# Patient Record
Sex: Male | Born: 1935 | Race: White | Hispanic: No | Marital: Married | Smoking: Former smoker
Health system: Southern US, Community
[De-identification: ages and names within clinical notes are randomized; demographics above are authoritative.]

## PROBLEM LIST (undated history)

## (undated) DIAGNOSIS — S0990XA Unspecified injury of head, initial encounter: Secondary | ICD-10-CM

## (undated) DIAGNOSIS — C801 Malignant (primary) neoplasm, unspecified: Secondary | ICD-10-CM

## (undated) DIAGNOSIS — R351 Nocturia: Secondary | ICD-10-CM

## (undated) DIAGNOSIS — M1990 Unspecified osteoarthritis, unspecified site: Secondary | ICD-10-CM

## (undated) DIAGNOSIS — K259 Gastric ulcer, unspecified as acute or chronic, without hemorrhage or perforation: Secondary | ICD-10-CM

## (undated) DIAGNOSIS — K219 Gastro-esophageal reflux disease without esophagitis: Secondary | ICD-10-CM

## (undated) DIAGNOSIS — T4145XA Adverse effect of unspecified anesthetic, initial encounter: Secondary | ICD-10-CM

## (undated) DIAGNOSIS — Z8719 Personal history of other diseases of the digestive system: Secondary | ICD-10-CM

## (undated) DIAGNOSIS — Z85828 Personal history of other malignant neoplasm of skin: Secondary | ICD-10-CM

## (undated) HISTORY — PX: JOINT REPLACEMENT: SHX530

## (undated) HISTORY — PX: SHOULDER SURGERY: SHX246

## (undated) HISTORY — PX: WRIST SURGERY: SHX841

---

## 1996-11-07 HISTORY — PX: TOTAL KNEE ARTHROPLASTY: SHX125

## 2003-06-03 ENCOUNTER — Encounter: Admission: RE | Admit: 2003-06-03 | Discharge: 2003-09-01 | Payer: Self-pay | Admitting: Orthopaedic Surgery

## 2005-08-11 ENCOUNTER — Ambulatory Visit: Payer: Self-pay | Admitting: Family Medicine

## 2006-08-29 ENCOUNTER — Ambulatory Visit: Payer: Self-pay | Admitting: Family Medicine

## 2006-09-29 ENCOUNTER — Ambulatory Visit: Payer: Self-pay | Admitting: Family Medicine

## 2012-08-24 ENCOUNTER — Encounter (HOSPITAL_COMMUNITY): Payer: Self-pay | Admitting: *Deleted

## 2012-08-24 ENCOUNTER — Emergency Department (HOSPITAL_COMMUNITY): Payer: Medicare Other

## 2012-08-24 ENCOUNTER — Emergency Department (HOSPITAL_COMMUNITY)
Admission: EM | Admit: 2012-08-24 | Discharge: 2012-08-24 | Disposition: A | Discharge status: Home / Self Care | Payer: Medicare Other | Attending: Emergency Medicine | Admitting: Emergency Medicine

## 2012-08-24 DIAGNOSIS — Z96659 Presence of unspecified artificial knee joint: Secondary | ICD-10-CM | POA: Insufficient documentation

## 2012-08-24 DIAGNOSIS — W208XXA Other cause of strike by thrown, projected or falling object, initial encounter: Secondary | ICD-10-CM | POA: Insufficient documentation

## 2012-08-24 DIAGNOSIS — S2239XA Fracture of one rib, unspecified side, initial encounter for closed fracture: Secondary | ICD-10-CM

## 2012-08-24 HISTORY — DX: Gastric ulcer, unspecified as acute or chronic, without hemorrhage or perforation: K25.9

## 2012-08-24 MED ORDER — HYDROCODONE-ACETAMINOPHEN 5-325 MG PO TABS
1.0000 | ORAL_TABLET | Freq: Four times a day (QID) | ORAL | Status: AC | PRN
Start: 2012-08-24 — End: 2012-09-03

## 2012-08-24 NOTE — ED Notes (Signed)
Pain lt lower ant ribs, 6 days ago cut down a tree and a branch struck him.

## 2012-08-24 NOTE — ED Provider Notes (Signed)
History   This chart was scribed for Bob Jakes, MD by Bob Holland. The patient was seen in room APA11/APA11 and the patient's care was started at 9:32PM    CSN: [id number]  Arrival date & time 08/24/12  8295   First MD Initiated Contact with Patient 08/24/12 2132      Chief Complaint  Patient presents with  . Chest Pain    (Consider location/radiation/quality/duration/timing/severity/associated sxs/prior treatment) Patient is a 78 y.o. male presenting with chest pain. The history is provided by the patient. No language interpreter was used.  Chest Pain The chest pain began yesterday. Chest pain occurs constantly. The chest pain is worsening. The pain is associated with breathing. The severity of the pain is moderate. The pain does not radiate. Chest pain is worsened by deep breathing. Pertinent negatives for primary symptoms include no abdominal pain and no dizziness.  Pertinent negatives for associated symptoms include no near-syncope. He tried nothing for the symptoms.     Bob Holland is a 78 y.o. male who presents to the Emergency Department complaining of sudden, progressively worsening, chest pain located at the left, anterior, lateral rib margin, onset yesterday (08/23/12). The pt reports he was cutting down a tree on Saturday, 08/18/12, when suddenly, a large portion of a tree branch struck him on the left side of his body. Modifying factors include deep breathing which intensifies the chest pain. The pt has a hx of joint replacement, total knee arthroplasty, and allergy to aspirin and cephalexin.   The pt denies abdominal pain, light headedness, back pain, and swelling in the legs.  The pt does not smoke or drink alcohol.   PCP is in [place], [place].    Past Medical History  Diagnosis Date  . Gastric ulcer     Past Surgical History  Procedure Date  . Joint replacement   . Total knee arthroplasty     History reviewed. No pertinent family history.  History    Substance Use Topics  . Smoking status: Never Smoker   . Smokeless tobacco: Not on file  . Alcohol Use: No      Review of Systems  Cardiovascular: Positive for chest pain. Negative for near-syncope.  Gastrointestinal: Negative for abdominal pain.  Neurological: Negative for dizziness.  All other systems reviewed and are negative.    Allergies  Aspirin and Cephalexin  Home Medications   Current Outpatient Rx  Name Route Sig Dispense Refill  . ASPIRIN 325 MG PO TABS Oral Take 325 mg by mouth once.    Marland Kitchen CALCIUM CARBONATE ANTACID 500 MG PO CHEW Oral Chew 1 tablet by mouth daily as needed. For indigestion, acid reflux, heartburn    . HYDROCODONE-ACETAMINOPHEN 5-325 MG PO TABS Oral Take 1-2 tablets by mouth every 6 (six) hours as needed for pain. 20 tablet 0    BP 140/78  Pulse 79  Temp 98.7 F (37.1 C) (Oral)  Resp 18  Ht 6' (1.829 m)  Wt 240 lb (108.863 kg)  BMI 32.55 kg/m2  SpO2 96%  Physical Exam  Nursing note and vitals reviewed. Constitutional: He is oriented to person, place, and time. He appears well-developed and well-nourished.  HENT:  Head: Atraumatic.  Nose: Nose normal.  Eyes: Conjunctivae normal and EOM are normal.  Neck: Normal range of motion.  Cardiovascular: Normal rate, regular rhythm and normal heart sounds.   Pulmonary/Chest: Effort normal and breath sounds normal. He exhibits tenderness.       Left rib margin tenderness. No contusion  or abrasions detected.   Abdominal: Soft. Bowel sounds are normal.  Musculoskeletal: Normal range of motion.       Bruise located on the medial side of left forearm. Abrasions located on the proximal left forearm.   Neurological: He is alert and oriented to person, place, and time.  Skin: Skin is warm and dry.  Psychiatric: He has a normal mood and affect. His behavior is normal.    ED Course  Procedures (including critical care time)  DIAGNOSTIC STUDIES: Oxygen Saturation is 96% on room air, normal by my  interpretation.    COORDINATION OF CARE:    9:45PM- Treatment plan concerning chest x-ray discussed with patient. Pt agrees with treatment.   Labs Reviewed - No data to display Dg Ribs Unilateral W/chest Left  08/24/2012  *RADIOLOGY REPORT*  Clinical Data: Left anterior rib/chest pain  LEFT RIBS AND CHEST - 3+ VIEW  Comparison: None.  Findings: Mild patchy left basilar opacity, likely atelectasis.  No pleural effusion or pneumothorax.  Cardiomediastinal silhouette is within normal limits.  Suspected nondisplaced left anterior 6th rib fracture.  IMPRESSION: Suspected nondisplaced left anterior 6th rib fracture.   Original Report Authenticated By: Charline Bills, M.D.      1. Rib fracture       MDM  Size andx-ray with rib series is consistent with a nondisplaced left anterior sixth rib fracture. This is probably responsible for the pain underlying chest x-ray of the lungs is normal. Will treat with mild pain medicine and reassurance have patient follow primary care Dr.      I personally performed the services described in this documentation, which was scribed in my presence. The recorded information has been reviewed and considered.     Bob Jakes, MD 08/24/12 2255

## 2015-11-08 DIAGNOSIS — T8859XA Other complications of anesthesia, initial encounter: Secondary | ICD-10-CM

## 2015-11-08 HISTORY — DX: Other complications of anesthesia, initial encounter: T88.59XA

## 2015-12-16 ENCOUNTER — Ambulatory Visit: Payer: Self-pay | Admitting: Orthopedic Surgery

## 2016-01-06 ENCOUNTER — Ambulatory Visit: Payer: Self-pay | Admitting: Orthopedic Surgery

## 2016-01-06 NOTE — H&P (Signed)
TOTAL KNEE ADMISSION H&P  Patient is being admitted for right total knee arthroplasty.  Subjective:  Chief Complaint:right knee pain.  HPI: Bob Holland, 82 y.o. male, has a history of pain and functional disability in the right knee due to arthritis and has failed non-surgical conservative treatments for greater than 12 weeks to includeNSAID's and/or analgesics, corticosteriod injections, flexibility and strengthening excercises, use of assistive devices, weight reduction as appropriate and activity modification.  Onset of symptoms was gradual, starting >10 years ago with rapidlly worsening course since that time. The patient noted no past surgery on the right knee(s).  Patient currently rates pain in the right knee(s) at 10 out of 10 with activity. Patient has night pain, worsening of pain with activity and weight bearing, pain that interferes with activities of daily living, pain with passive range of motion and crepitus.  Patient has evidence of subchondral cysts, subchondral sclerosis, periarticular osteophytes, joint subluxation and joint space narrowing by imaging studies. There is no active infection.  There are no active problems to display for this patient.  Past Medical History  Diagnosis Date  . Gastric ulcer     Past Surgical History  Procedure Laterality Date  . Joint replacement    . Total knee arthroplasty       (Not in a hospital admission) Allergies  Allergen Reactions  . Aspirin Other (See Comments)    REACTION: Cannot take due to bleeding ulcers  . Cephalexin Swelling and Rash    Social History  Substance Use Topics  . Smoking status: Never Smoker   . Smokeless tobacco: Not on file  . Alcohol Use: No    No family history on file.   Review of Systems  Constitutional: Negative.   HENT: Negative.   Eyes: Negative.   Respiratory: Negative.   Cardiovascular: Negative.   Gastrointestinal: Negative.   Musculoskeletal: Positive for joint pain.  Skin: Negative.    Neurological: Negative.   Endo/Heme/Allergies: Negative.   Psychiatric/Behavioral: Negative.     Objective:  Physical Exam  Constitutional: He is oriented to person, place, and time. He appears well-developed and well-nourished.  HENT:  Head: Normocephalic and atraumatic.  Eyes: Conjunctivae and EOM are normal. Pupils are equal, round, and reactive to light.  Neck: Normal range of motion. Neck supple.  Cardiovascular: Normal rate, regular rhythm and intact distal pulses.   Respiratory: Effort normal and breath sounds normal. No respiratory distress.  GI: Soft. Bowel sounds are normal. He exhibits no distension.  Genitourinary:  deferred  Musculoskeletal:       Right knee: He exhibits decreased range of motion and LCL laxity. Tenderness found. Medial joint line tenderness noted.  ROM 18-80. 2+ DP  Neurological: He is alert and oriented to person, place, and time. He has normal reflexes.  Skin: Skin is warm and dry.  Psychiatric: He has a normal mood and affect. His behavior is normal. Judgment and thought content normal.    Vital signs in last 24 hours: @VSRANGES @  Labs:   Estimated body mass index is 32.54 kg/(m^2) as calculated from the following:   Height as of 08/24/12: 6' (1.829 m).   Weight as of 08/24/12: 108.863 kg (240 lb).   Imaging Review Plain radiographs demonstrate severe degenerative joint disease of the right knee(s). The overall alignment issignificant varus. The bone quality appears to be fair for age and reported activity level.  Assessment/Plan:  End stage arthritis, right knee   The patient history, physical examination, clinical judgment of the provider  and imaging studies are consistent with end stage degenerative joint disease of the right knee(s) and total knee arthroplasty is deemed medically necessary. The treatment options including medical management, injection therapy arthroscopy and arthroplasty were discussed at length. The risks and  benefits of total knee arthroplasty were presented and reviewed. The risks due to aseptic loosening, infection, stiffness, patella tracking problems, thromboembolic complications and other imponderables were discussed. The patient acknowledged the explanation, agreed to proceed with the plan and consent was signed. Patient is being admitted for inpatient treatment for surgery, pain control, PT, OT, prophylactic antibiotics, VTE prophylaxis, progressive ambulation and ADL's and discharge planning. The patient is planning to be discharged home with home health services

## 2016-01-14 ENCOUNTER — Encounter (HOSPITAL_COMMUNITY)
Admission: RE | Admit: 2016-01-14 | Discharge: 2016-01-14 | Disposition: A | Discharge status: Home / Self Care | Payer: Medicare HMO | Source: Ambulatory Visit | Attending: Orthopedic Surgery | Admitting: Orthopedic Surgery

## 2016-01-14 ENCOUNTER — Encounter (HOSPITAL_COMMUNITY): Payer: Self-pay

## 2016-01-14 DIAGNOSIS — M1711 Unilateral primary osteoarthritis, right knee: Secondary | ICD-10-CM | POA: Diagnosis not present

## 2016-01-14 DIAGNOSIS — Z01812 Encounter for preprocedural laboratory examination: Secondary | ICD-10-CM | POA: Insufficient documentation

## 2016-01-14 DIAGNOSIS — Z0183 Encounter for blood typing: Secondary | ICD-10-CM | POA: Diagnosis not present

## 2016-01-14 HISTORY — DX: Malignant (primary) neoplasm, unspecified: C80.1

## 2016-01-14 HISTORY — DX: Personal history of other malignant neoplasm of skin: Z85.828

## 2016-01-14 HISTORY — DX: Nocturia: R35.1

## 2016-01-14 HISTORY — DX: Unspecified osteoarthritis, unspecified site: M19.90

## 2016-01-14 LAB — PROTIME-INR
INR: 1.21 (ref 0.00–1.49)
Prothrombin Time: 15 seconds (ref 11.6–15.2)

## 2016-01-14 LAB — URINALYSIS, ROUTINE W REFLEX MICROSCOPIC
BILIRUBIN URINE: NEGATIVE
GLUCOSE, UA: NEGATIVE mg/dL
HGB URINE DIPSTICK: NEGATIVE
Ketones, ur: NEGATIVE mg/dL
Leukocytes, UA: NEGATIVE
Nitrite: NEGATIVE
Protein, ur: NEGATIVE mg/dL
SPECIFIC GRAVITY, URINE: 1.027 (ref 1.005–1.030)
pH: 5 (ref 5.0–8.0)

## 2016-01-14 LAB — CBC WITH DIFFERENTIAL/PLATELET
BASOS PCT: 1 %
Basophils Absolute: 0.1 10*3/uL (ref 0.0–0.1)
EOS ABS: 0.2 10*3/uL (ref 0.0–0.7)
EOS PCT: 3 %
HCT: 43 % (ref 39.0–52.0)
Hemoglobin: 14.4 g/dL (ref 13.0–17.0)
Lymphocytes Relative: 36 %
Lymphs Abs: 2.5 10*3/uL (ref 0.7–4.0)
MCH: 30.1 pg (ref 26.0–34.0)
MCHC: 33.5 g/dL (ref 30.0–36.0)
MCV: 90 fL (ref 78.0–100.0)
MONO ABS: 0.9 10*3/uL (ref 0.1–1.0)
MONOS PCT: 13 %
Neutro Abs: 3.3 10*3/uL (ref 1.7–7.7)
Neutrophils Relative %: 47 %
Platelets: 186 10*3/uL (ref 150–400)
RBC: 4.78 MIL/uL (ref 4.22–5.81)
RDW: 14 % (ref 11.5–15.5)
WBC: 6.9 10*3/uL (ref 4.0–10.5)

## 2016-01-14 LAB — COMPREHENSIVE METABOLIC PANEL
ALBUMIN: 3.7 g/dL (ref 3.5–5.0)
ALT: 17 U/L (ref 17–63)
ANION GAP: 7 (ref 5–15)
AST: 23 U/L (ref 15–41)
Alkaline Phosphatase: 68 U/L (ref 38–126)
BUN: 17 mg/dL (ref 6–20)
CO2: 25 mmol/L (ref 22–32)
Calcium: 8.7 mg/dL — ABNORMAL LOW (ref 8.9–10.3)
Chloride: 107 mmol/L (ref 101–111)
Creatinine, Ser: 0.92 mg/dL (ref 0.61–1.24)
GFR calc non Af Amer: >60 mL/min (ref >60)
GLUCOSE: 95 mg/dL (ref 65–99)
POTASSIUM: 4.5 mmol/L (ref 3.5–5.1)
SODIUM: 139 mmol/L (ref 135–145)
TOTAL PROTEIN: 7.5 g/dL (ref 6.5–8.1)
Total Bilirubin: 0.6 mg/dL (ref 0.3–1.2)

## 2016-01-14 LAB — ABO/RH: ABO/RH(D): O NEG

## 2016-01-14 LAB — APTT: aPTT: 35 seconds (ref 24–37)

## 2016-01-14 LAB — SURGICAL PCR SCREEN
MRSA, PCR: NEGATIVE
STAPHYLOCOCCUS AUREUS: NEGATIVE

## 2016-01-14 NOTE — Patient Instructions (Addendum)
YOUR PROCEDURE IS SCHEDULED ON : 01/21/16  REPORT TO [facility] MAIN ENTRANCE FOLLOW SIGNS TO EAST ELEVATOR - GO TO 3rd FLOOR CHECK IN AT 3 EAST NURSES STATION (SHORT STAY) AT:  1:45 PM  CALL THIS NUMBER IF YOU HAVE PROBLEMS THE MORNING OF SURGERY [phone]  REMEMBER:ONLY 1 PER PERSON MAY GO TO SHORT STAY WITH YOU TO GET READY THE MORNING OF YOUR SURGERY  DO NOT EAT FOOD  AFTER MIDNIGHT  MAY HAVE CLEAR LIQUIDS UNTIL 9:45 AM  TAKE THESE MEDICINES THE MORNING OF SURGERY: NONE  CLEAR LIQUID DIET  Foods Allowed                                                                     Foods Excluded  Coffee and tea, regular and decaf                             liquids that you cannot  Plain Jell-O in any flavor                                             see through such as: Fruit ices (not with fruit pulp)                                     milk, soups, orange juice  Iced Popsicles                                                  Creamers                                                                                                     Carbonated beverages, regular and diet                  ALL SOLID FOOD                 Cranberry, grape and apple juices Sports drinks like Gatorade Lightly seasoned clear broth or consume(fat free) Sugar, honey syrup  _____________________________________________________________________   YOU MAY NOT HAVE ANY METAL ON YOUR BODY INCLUDING HAIR PINS AND PIERCING'S. DO NOT WEAR JEWELRY, MAKEUP, LOTIONS, POWDERS OR PERFUMES. DO NOT WEAR NAIL POLISH. DO NOT SHAVE 48 HRS PRIOR TO SURGERY. MEN MAY SHAVE FACE AND NECK.  DO NOT [place]. [facility] IS NOT RESPONSIBLE FOR VALUABLES.  CONTACTS, DENTURES OR PARTIALS MAY NOT BE WORN TO SURGERY. LEAVE  SUITCASE IN CAR. CAN BE BROUGHT TO ROOM AFTER SURGERY.  PATIENTS DISCHARGED THE DAY OF SURGERY WILL NOT BE ALLOWED TO DRIVE HOME.  PLEASE READ OVER THE FOLLOWING  INSTRUCTION SHEETS _________________________________________________________________________________                                          Bob Holland - PREPARING FOR SURGERY  Before surgery, you can play an important role.  Because skin is not sterile, your skin needs to be as free of germs as possible.  You can reduce the number of germs on your skin by washing with CHG (chlorahexidine gluconate) soap before surgery.  CHG is an antiseptic cleaner which kills germs and bonds with the skin to continue killing germs even after washing. Please DO NOT use if you have an allergy to CHG or antibacterial soaps.  If your skin becomes reddened/irritated stop using the CHG and inform your nurse when you arrive at Short Stay. Do not shave (including legs and underarms) for at least 48 hours prior to the first CHG shower.  You may shave your face. Please follow these instructions carefully:   1.  Shower with CHG Soap the night before surgery and the  morning of Surgery.   2.  If you choose to wash your hair, wash your hair first as usual with your  normal  Shampoo.   3.  After you shampoo, rinse your hair and body thoroughly to remove the  shampoo.                                         4.  Use CHG as you would any other liquid soap.  You can apply chg directly  to the skin and wash . Gently wash with scrungie or clean wascloth    5.  Apply the CHG Soap to your body ONLY FROM THE NECK DOWN.   Do not use on open                           Wound or open sores. Avoid contact with eyes, ears mouth and genitals (private parts).                        Genitals (private parts) with your normal soap.              6.  Wash thoroughly, paying special attention to the area where your surgery  will be performed.   7.  Thoroughly rinse your body with warm water from the neck down.   8.  DO NOT shower/wash with your normal soap after using and rinsing off  the CHG Soap .                9.  Pat yourself dry  with a clean towel.             10.  Wear clean night clothes to bed after shower             11.  Place clean sheets on your bed the night of your first shower and do not  sleep with pets.  Day of Surgery : Do not apply any lotions/deodorants the morning of surgery.  Please wear clean clothes  to the hospital/surgery center.  FAILURE TO FOLLOW THESE INSTRUCTIONS MAY RESULT IN THE CANCELLATION OF YOUR SURGERY    PATIENT SIGNATURE_________________________________  ______________________________________________________________________     Bob Holland  An incentive spirometer is a tool that can help keep your lungs clear and active. This tool measures how well you are filling your lungs with each breath. Taking long deep breaths may help reverse or decrease the chance of developing breathing (pulmonary) problems (especially infection) following:  A long period of time when you are unable to move or be active. BEFORE THE PROCEDURE   If the spirometer includes an indicator to show your best effort, your nurse or respiratory therapist will set it to a desired goal.  If possible, sit up straight or lean slightly forward. Try not to slouch.  Hold the incentive spirometer in an upright position. INSTRUCTIONS FOR USE   Sit on the edge of your bed if possible, or sit up as far as you can in bed or on a chair.  Hold the incentive spirometer in an upright position.  Breathe out normally.  Place the mouthpiece in your mouth and seal your lips tightly around it.  Breathe in slowly and as deeply as possible, raising the piston or the ball toward the top of the column.  Hold your breath for 3-5 seconds or for as long as possible. Allow the piston or ball to fall to the bottom of the column.  Remove the mouthpiece from your mouth and breathe out normally.  Rest for a few seconds and repeat Steps 1 through 7 at least 10 times every 1-2 hours when you are awake. Take your time and  take a few normal breaths between deep breaths.  The spirometer may include an indicator to show your best effort. Use the indicator as a goal to work toward during each repetition.  After each set of 10 deep breaths, practice coughing to be sure your lungs are clear. If you have an incision (the cut made at the time of surgery), support your incision when coughing by placing a pillow or rolled up towels firmly against it. Once you are able to get out of bed, walk around indoors and cough well. You may stop using the incentive spirometer when instructed by your caregiver.  RISKS AND COMPLICATIONS  Take your time so you do not get dizzy or light-headed.  If you are in pain, you may need to take or ask for pain medication before doing incentive spirometry. It is harder to take a deep breath if you are having pain. AFTER USE  Rest and breathe slowly and easily.  It can be helpful to keep track of a log of your progress. Your caregiver can provide you with a simple table to help with this. If you are using the spirometer at home, follow these instructions: [place] IF:   You are having difficultly using the spirometer.  You have trouble using the spirometer as often as instructed.  Your pain medication is not giving enough relief while using the spirometer.  You develop fever of 100.5 F (38.1 C) or higher. SEEK IMMEDIATE MEDICAL CARE IF:   You cough up bloody sputum that had not been present before.  You develop fever of 102 F (38.9 C) or greater.  You develop worsening pain at or near the incision site. MAKE SURE YOU:   Understand these instructions.  Will watch your condition.  Will get help right away if you are not doing well  or get worse. Document Released: 03/06/2007 Document Revised: 01/16/2012 Document Reviewed: 05/07/2007 [employer] Patient Information 2014 [employer], [place].   ________________________________________________________________________  WHAT  IS A BLOOD TRANSFUSION? Blood Transfusion Information  A transfusion is the replacement of blood or some of its parts. Blood is made up of multiple cells which provide different functions.  Red blood cells carry oxygen and are used for blood loss replacement.  White blood cells fight against infection.  Platelets control bleeding.  Plasma helps clot blood.  Other blood products are available for specialized needs, such as hemophilia or other clotting disorders. BEFORE THE TRANSFUSION  Who gives blood for transfusions?   Healthy volunteers who are fully evaluated to make sure their blood is safe. This is blood bank blood. Transfusion therapy is the safest it has ever been in the practice of medicine. Before blood is taken from a donor, a complete history is taken to make sure that person has no history of diseases nor engages in risky social behavior (examples are intravenous drug use or sexual activity with multiple partners). The donor's travel history is screened to minimize risk of transmitting infections, such as malaria. The donated blood is tested for signs of infectious diseases, such as HIV and hepatitis. The blood is then tested to be sure it is compatible with you in order to minimize the chance of a transfusion reaction. If you or a relative donates blood, this is often done in anticipation of surgery and is not appropriate for emergency situations. It takes many days to process the donated blood. RISKS AND COMPLICATIONS Although transfusion therapy is very safe and saves many lives, the main dangers of transfusion include:   Getting an infectious disease.  Developing a transfusion reaction. This is an allergic reaction to something in the blood you were given. Every precaution is taken to prevent this. The decision to have a blood transfusion has been considered carefully by your caregiver before blood is given. Blood is not given unless the benefits outweigh the risks. AFTER THE  TRANSFUSION  Right after receiving a blood transfusion, you will usually feel much better and more energetic. This is especially true if your red blood cells have gotten low (anemic). The transfusion raises the level of the red blood cells which carry oxygen, and this usually causes an energy increase.  The nurse administering the transfusion will monitor you carefully for complications. HOME CARE INSTRUCTIONS  No special instructions are needed after a transfusion. You may find your energy is better. Speak with your caregiver about any limitations on activity for underlying diseases you may have. SEEK MEDICAL CARE IF:   Your condition is not improving after your transfusion.  You develop redness or irritation at the intravenous (IV) site. SEEK IMMEDIATE MEDICAL CARE IF:  Any of the following symptoms occur over the next 12 hours:  Shaking chills.  You have a temperature by mouth above 102 F (38.9 C), not controlled by medicine.  Chest, back, or muscle pain.  People around you feel you are not acting correctly or are confused.  Shortness of breath or difficulty breathing.  Dizziness and fainting.  You get a rash or develop hives.  You have a decrease in urine output.  Your urine turns a dark color or changes to pink, red, or brown. Any of the following symptoms occur over the next 10 days:  You have a temperature by mouth above 102 F (38.9 C), not controlled by medicine.  Shortness of breath.  Weakness after normal activity.  The white part of the eye turns yellow (jaundice).  You have a decrease in the amount of urine or are urinating less often.  Your urine turns a dark color or changes to pink, red, or brown. Document Released: 10/21/2000 Document Revised: 01/16/2012 Document Reviewed: 06/09/2008 [facility] Patient Information 2014 [place], [place].  _______________________________________________________________________

## 2016-01-20 NOTE — Progress Notes (Signed)
PT informed of surgery time change - instructed to arrive at 10:45 am - clear liquids until 6:45 am

## 2016-01-21 ENCOUNTER — Encounter (HOSPITAL_COMMUNITY): Payer: Self-pay | Admitting: *Deleted

## 2016-01-21 ENCOUNTER — Inpatient Hospital Stay (HOSPITAL_COMMUNITY): Payer: Medicare HMO

## 2016-01-21 ENCOUNTER — Encounter (HOSPITAL_COMMUNITY)
Admission: RE | Disposition: A | Discharge status: Home Health | Payer: Self-pay | Source: Ambulatory Visit | Attending: Orthopedic Surgery

## 2016-01-21 ENCOUNTER — Inpatient Hospital Stay (HOSPITAL_COMMUNITY): Payer: Medicare HMO | Admitting: Anesthesiology

## 2016-01-21 ENCOUNTER — Inpatient Hospital Stay: Admit: 2016-01-21 | Payer: Self-pay | Admitting: Orthopedic Surgery

## 2016-01-21 ENCOUNTER — Inpatient Hospital Stay (HOSPITAL_COMMUNITY)
Admission: RE | Admit: 2016-01-21 | Discharge: 2016-01-22 | DRG: 470 | Disposition: A | Discharge status: Home Health | Payer: Medicare HMO | Source: Ambulatory Visit | Attending: Orthopedic Surgery | Admitting: Orthopedic Surgery

## 2016-01-21 DIAGNOSIS — M24562 Contracture, left knee: Secondary | ICD-10-CM | POA: Diagnosis present

## 2016-01-21 DIAGNOSIS — Z888 Allergy status to other drugs, medicaments and biological substances status: Secondary | ICD-10-CM

## 2016-01-21 DIAGNOSIS — Z886 Allergy status to analgesic agent status: Secondary | ICD-10-CM

## 2016-01-21 DIAGNOSIS — Z09 Encounter for follow-up examination after completed treatment for conditions other than malignant neoplasm: Secondary | ICD-10-CM

## 2016-01-21 DIAGNOSIS — M24561 Contracture, right knee: Secondary | ICD-10-CM | POA: Diagnosis present

## 2016-01-21 DIAGNOSIS — Z85828 Personal history of other malignant neoplasm of skin: Secondary | ICD-10-CM | POA: Diagnosis not present

## 2016-01-21 DIAGNOSIS — M1711 Unilateral primary osteoarthritis, right knee: Secondary | ICD-10-CM | POA: Diagnosis present

## 2016-01-21 HISTORY — PX: KNEE ARTHROPLASTY: SHX992

## 2016-01-21 LAB — TYPE AND SCREEN
ABO/RH(D): O NEG
ANTIBODY SCREEN: NEGATIVE

## 2016-01-21 SURGERY — ARTHROPLASTY, KNEE, TOTAL, USING IMAGELESS COMPUTER-ASSISTED NAVIGATION
Anesthesia: General | Site: Knee | Laterality: Right

## 2016-01-21 MED ORDER — DOCUSATE SODIUM 100 MG PO CAPS
100.0000 mg | ORAL_CAPSULE | Freq: Two times a day (BID) | ORAL | Status: DC
Start: 2016-01-21 — End: 2016-01-22
  Administered 2016-01-21 – 2016-01-22 (×2): 100 mg via ORAL

## 2016-01-21 MED ORDER — ROCURONIUM BROMIDE 100 MG/10ML IV SOLN
INTRAVENOUS | Status: AC
  Filled 2016-01-21: qty 1

## 2016-01-21 MED ORDER — SODIUM CHLORIDE 0.9 % IV SOLN: INTRAVENOUS | Status: DC

## 2016-01-21 MED ORDER — CLINDAMYCIN PHOSPHATE 600 MG/50ML IV SOLN
600.0000 mg | Freq: Four times a day (QID) | INTRAVENOUS | Status: AC
  Administered 2016-01-21: 600 mg via INTRAVENOUS
  Filled 2016-01-21 (×2): qty 50

## 2016-01-21 MED ORDER — ONDANSETRON HCL 4 MG/2ML IJ SOLN
4.0000 mg | Freq: Four times a day (QID) | INTRAMUSCULAR | Status: DC | PRN
  Administered 2016-01-21: 4 mg via INTRAVENOUS
  Filled 2016-01-21: qty 2

## 2016-01-21 MED ORDER — TRANEXAMIC ACID 1000 MG/10ML IV SOLN
1000.0000 mg | Freq: Once | INTRAVENOUS | Status: AC
  Administered 2016-01-21: 1000 mg via INTRAVENOUS
  Filled 2016-01-21: qty 10

## 2016-01-21 MED ORDER — HYDROMORPHONE HCL 2 MG/ML IJ SOLN
INTRAMUSCULAR | Status: AC
  Filled 2016-01-21: qty 1

## 2016-01-21 MED ORDER — ACETAMINOPHEN 650 MG RE SUPP: 650.0000 mg | Freq: Four times a day (QID) | RECTAL | Status: DC | PRN

## 2016-01-21 MED ORDER — APIXABAN 2.5 MG PO TABS
2.5000 mg | ORAL_TABLET | Freq: Two times a day (BID) | ORAL | Status: DC
  Administered 2016-01-22: 2.5 mg via ORAL
  Filled 2016-01-21 (×3): qty 1

## 2016-01-21 MED ORDER — HYDROGEN PEROXIDE 3 % EX SOLN
CUTANEOUS | Status: DC | PRN
  Administered 2016-01-21: 1

## 2016-01-21 MED ORDER — PROMETHAZINE HCL 25 MG/ML IJ SOLN: 6.2500 mg | INTRAMUSCULAR | Status: DC | PRN

## 2016-01-21 MED ORDER — ISOPROPYL ALCOHOL (RUBBING) 70 % SOLN
Status: DC | PRN
  Administered 2016-01-21: 60 mL via TOPICAL

## 2016-01-21 MED ORDER — ALUM & MAG HYDROXIDE-SIMETH 200-200-20 MG/5ML PO SUSP: 30.0000 mL | ORAL | Status: DC | PRN

## 2016-01-21 MED ORDER — POVIDONE-IODINE 10 % EX SOLN
CUTANEOUS | Status: DC | PRN
  Administered 2016-01-21: 1 via TOPICAL

## 2016-01-21 MED ORDER — HYDROGEN PEROXIDE 3 % EX SOLN
CUTANEOUS | Status: AC
  Filled 2016-01-21: qty 473

## 2016-01-21 MED ORDER — SODIUM CHLORIDE 0.9 % IR SOLN
Status: DC | PRN
  Administered 2016-01-21: 1000 mL

## 2016-01-21 MED ORDER — BUPIVACAINE HCL (PF) 0.5 % IJ SOLN
INTRAMUSCULAR | Status: DC | PRN
  Administered 2016-01-21: 20 mL via PERINEURAL

## 2016-01-21 MED ORDER — KETOROLAC TROMETHAMINE 30 MG/ML IJ SOLN
INTRAMUSCULAR | Status: DC | PRN
  Administered 2016-01-21: 30 mg

## 2016-01-21 MED ORDER — SODIUM CHLORIDE 0.9 % IR SOLN
Status: DC | PRN
  Administered 2016-01-21: 2000 mL

## 2016-01-21 MED ORDER — LIDOCAINE HCL (CARDIAC) 20 MG/ML IV SOLN
INTRAVENOUS | Status: AC
  Filled 2016-01-21: qty 5

## 2016-01-21 MED ORDER — LIDOCAINE-EPINEPHRINE 2 %-1:100000 IJ SOLN
INTRAMUSCULAR | Status: DC | PRN
  Administered 2016-01-21: 10 mL via PERINEURAL

## 2016-01-21 MED ORDER — MIDAZOLAM HCL 5 MG/5ML IJ SOLN
INTRAMUSCULAR | Status: DC | PRN
  Administered 2016-01-21 (×2): 1 mg via INTRAVENOUS

## 2016-01-21 MED ORDER — MIDAZOLAM HCL 2 MG/2ML IJ SOLN
INTRAMUSCULAR | Status: AC
  Filled 2016-01-21: qty 2

## 2016-01-21 MED ORDER — STERILE WATER FOR IRRIGATION IR SOLN
Status: DC | PRN
  Administered 2016-01-21: 2000 mL

## 2016-01-21 MED ORDER — PROPOFOL 10 MG/ML IV BOLUS
INTRAVENOUS | Status: DC | PRN
Start: 2016-01-21 — End: 2016-01-21
  Administered 2016-01-21: 150 mg via INTRAVENOUS

## 2016-01-21 MED ORDER — DEXAMETHASONE SODIUM PHOSPHATE 10 MG/ML IJ SOLN
INTRAMUSCULAR | Status: DC | PRN
  Administered 2016-01-21: 10 mg via INTRAVENOUS

## 2016-01-21 MED ORDER — HYDROMORPHONE HCL 1 MG/ML IJ SOLN: 0.5000 mg | INTRAMUSCULAR | Status: DC | PRN

## 2016-01-21 MED ORDER — BUPIVACAINE-EPINEPHRINE (PF) 0.25% -1:200000 IJ SOLN
INTRAMUSCULAR | Status: AC
  Filled 2016-01-21: qty 30

## 2016-01-21 MED ORDER — CHLORHEXIDINE GLUCONATE 4 % EX LIQD: 60.0000 mL | Freq: Once | CUTANEOUS | Status: DC

## 2016-01-21 MED ORDER — ONDANSETRON HCL 4 MG PO TABS: 4.0000 mg | ORAL_TABLET | Freq: Four times a day (QID) | ORAL | Status: DC | PRN

## 2016-01-21 MED ORDER — FENTANYL CITRATE (PF) 250 MCG/5ML IJ SOLN
INTRAMUSCULAR | Status: AC
  Filled 2016-01-21: qty 5

## 2016-01-21 MED ORDER — ROCURONIUM BROMIDE 100 MG/10ML IV SOLN
INTRAVENOUS | Status: DC | PRN
  Administered 2016-01-21: 30 mg via INTRAVENOUS
  Administered 2016-01-21 (×3): 10 mg via INTRAVENOUS

## 2016-01-21 MED ORDER — KETOROLAC TROMETHAMINE 30 MG/ML IJ SOLN
INTRAMUSCULAR | Status: AC
  Filled 2016-01-21: qty 1

## 2016-01-21 MED ORDER — SODIUM CHLORIDE 0.9 % IJ SOLN
INTRAMUSCULAR | Status: DC | PRN
  Administered 2016-01-21: 30 mL

## 2016-01-21 MED ORDER — PROPOFOL 10 MG/ML IV BOLUS
INTRAVENOUS | Status: AC
  Filled 2016-01-21: qty 20

## 2016-01-21 MED ORDER — SODIUM CHLORIDE 0.9 % IV SOLN
INTRAVENOUS | Status: DC
Start: 2016-01-21 — End: 2016-01-22
  Administered 2016-01-21: 20:00:00 via INTRAVENOUS

## 2016-01-21 MED ORDER — DEXAMETHASONE SODIUM PHOSPHATE 10 MG/ML IJ SOLN
INTRAMUSCULAR | Status: AC
  Filled 2016-01-21: qty 1

## 2016-01-21 MED ORDER — CLINDAMYCIN PHOSPHATE 900 MG/50ML IV SOLN
900.0000 mg | INTRAVENOUS | Status: AC
  Administered 2016-01-21: 900 mg via INTRAVENOUS

## 2016-01-21 MED ORDER — ACETAMINOPHEN 10 MG/ML IV SOLN
1000.0000 mg | INTRAVENOUS | Status: AC
  Administered 2016-01-21: 1000 mg via INTRAVENOUS

## 2016-01-21 MED ORDER — EPHEDRINE SULFATE 50 MG/ML IJ SOLN
INTRAMUSCULAR | Status: DC | PRN
  Administered 2016-01-21 (×4): 5 mg via INTRAVENOUS

## 2016-01-21 MED ORDER — CLINDAMYCIN PHOSPHATE 900 MG/50ML IV SOLN
INTRAVENOUS | Status: AC
  Filled 2016-01-21: qty 50

## 2016-01-21 MED ORDER — HYDROMORPHONE HCL 1 MG/ML IJ SOLN: 0.2500 mg | INTRAMUSCULAR | Status: DC | PRN

## 2016-01-21 MED ORDER — ACETAMINOPHEN 10 MG/ML IV SOLN
INTRAVENOUS | Status: AC
  Filled 2016-01-21: qty 100

## 2016-01-21 MED ORDER — LIDOCAINE HCL (CARDIAC) 20 MG/ML IV SOLN
INTRAVENOUS | Status: DC | PRN
  Administered 2016-01-21: 75 mg via INTRAVENOUS

## 2016-01-21 MED ORDER — GLYCOPYRROLATE 0.2 MG/ML IJ SOLN
INTRAMUSCULAR | Status: DC | PRN
  Administered 2016-01-21: 0.2 mg via INTRAVENOUS

## 2016-01-21 MED ORDER — ONDANSETRON HCL 4 MG/2ML IJ SOLN
INTRAMUSCULAR | Status: AC
  Filled 2016-01-21: qty 2

## 2016-01-21 MED ORDER — ACETAMINOPHEN 325 MG PO TABS: 650.0000 mg | ORAL_TABLET | Freq: Four times a day (QID) | ORAL | Status: DC | PRN

## 2016-01-21 MED ORDER — KETOROLAC TROMETHAMINE 15 MG/ML IJ SOLN
7.5000 mg | Freq: Four times a day (QID) | INTRAMUSCULAR | Status: DC
  Filled 2016-01-21: qty 1

## 2016-01-21 MED ORDER — TRANEXAMIC ACID 1000 MG/10ML IV SOLN
1000.0000 mg | INTRAVENOUS | Status: AC
  Administered 2016-01-21: 1000 mg via INTRAVENOUS
  Filled 2016-01-21: qty 10

## 2016-01-21 MED ORDER — DEXAMETHASONE SODIUM PHOSPHATE 10 MG/ML IJ SOLN
10.0000 mg | Freq: Once | INTRAMUSCULAR | Status: DC
  Filled 2016-01-21: qty 1

## 2016-01-21 MED ORDER — SODIUM CHLORIDE 0.9 % IJ SOLN
INTRAMUSCULAR | Status: AC
  Filled 2016-01-21: qty 50

## 2016-01-21 MED ORDER — MENTHOL 3 MG MT LOZG: 1.0000 | LOZENGE | OROMUCOSAL | Status: DC | PRN

## 2016-01-21 MED ORDER — DIPHENHYDRAMINE HCL 12.5 MG/5ML PO ELIX: 12.5000 mg | ORAL_SOLUTION | ORAL | Status: DC | PRN

## 2016-01-21 MED ORDER — FENTANYL CITRATE (PF) 100 MCG/2ML IJ SOLN
INTRAMUSCULAR | Status: DC | PRN
  Administered 2016-01-21: 50 ug via INTRAVENOUS
  Administered 2016-01-21: 25 ug via INTRAVENOUS
  Administered 2016-01-21 (×3): 50 ug via INTRAVENOUS
  Administered 2016-01-21: 25 ug via INTRAVENOUS

## 2016-01-21 MED ORDER — HYDROCODONE-ACETAMINOPHEN 5-325 MG PO TABS
1.0000 | ORAL_TABLET | ORAL | Status: DC | PRN
  Administered 2016-01-22: 1 via ORAL
  Filled 2016-01-21: qty 1

## 2016-01-21 MED ORDER — ONDANSETRON HCL 4 MG/2ML IJ SOLN
INTRAMUSCULAR | Status: DC | PRN
  Administered 2016-01-21: 4 mg via INTRAVENOUS

## 2016-01-21 MED ORDER — SENNA 8.6 MG PO TABS
2.0000 | ORAL_TABLET | Freq: Every day | ORAL | Status: DC
Start: 2016-01-21 — End: 2016-01-22
  Administered 2016-01-21: 17.2 mg via ORAL

## 2016-01-21 MED ORDER — NEOSTIGMINE METHYLSULFATE 10 MG/10ML IV SOLN
INTRAVENOUS | Status: DC | PRN
  Administered 2016-01-21: 3 mg via INTRAVENOUS

## 2016-01-21 MED ORDER — PHENOL 1.4 % MT LIQD: 1.0000 | OROMUCOSAL | Status: DC | PRN

## 2016-01-21 MED ORDER — SUCCINYLCHOLINE CHLORIDE 20 MG/ML IJ SOLN
INTRAMUSCULAR | Status: DC | PRN
  Administered 2016-01-21: 120 mg via INTRAVENOUS

## 2016-01-21 MED ORDER — BUPIVACAINE HCL (PF) 0.5 % IJ SOLN
INTRAMUSCULAR | Status: AC
  Filled 2016-01-21: qty 30

## 2016-01-21 MED ORDER — METOCLOPRAMIDE HCL 5 MG/ML IJ SOLN
5.0000 mg | Freq: Three times a day (TID) | INTRAMUSCULAR | Status: DC | PRN
  Filled 2016-01-21: qty 2

## 2016-01-21 MED ORDER — METOCLOPRAMIDE HCL 10 MG PO TABS: 5.0000 mg | ORAL_TABLET | Freq: Three times a day (TID) | ORAL | Status: DC | PRN

## 2016-01-21 MED ORDER — PHENYLEPHRINE HCL 10 MG/ML IJ SOLN
INTRAMUSCULAR | Status: DC | PRN
  Administered 2016-01-21: 20 ug via INTRAVENOUS
  Administered 2016-01-21: 40 ug via INTRAVENOUS
  Administered 2016-01-21 (×3): 20 ug via INTRAVENOUS

## 2016-01-21 MED ORDER — BUPIVACAINE-EPINEPHRINE 0.25% -1:200000 IJ SOLN
INTRAMUSCULAR | Status: DC | PRN
  Administered 2016-01-21: 30 mL

## 2016-01-21 MED ORDER — HYDROMORPHONE HCL 1 MG/ML IJ SOLN
INTRAMUSCULAR | Status: DC | PRN
  Administered 2016-01-21 (×2): 1 mg via INTRAVENOUS

## 2016-01-21 MED ORDER — ISOPROPYL ALCOHOL 70 % SOLN
Status: AC
  Filled 2016-01-21: qty 480

## 2016-01-21 MED ORDER — LIDOCAINE-EPINEPHRINE (PF) 2 %-1:200000 IJ SOLN
INTRAMUSCULAR | Status: AC
  Filled 2016-01-21: qty 20

## 2016-01-21 MED ORDER — LACTATED RINGERS IV SOLN
INTRAVENOUS | Status: DC | PRN
  Administered 2016-01-21 (×3): via INTRAVENOUS

## 2016-01-21 SURGICAL SUPPLY — 77 items
ANCH SUT 2 5.5 BABSR ASCP (Orthopedic Implant) ×1 IMPLANT
ANCHOR PEEK ZIP 5.5 NDL NO2 (Orthopedic Implant) ×2 IMPLANT
BAG SPEC THK2 15X12 ZIP CLS (MISCELLANEOUS) ×1
BAG ZIPLOCK 12X15 (MISCELLANEOUS) ×3 IMPLANT
BANDAGE ACE 4X5 VEL STRL LF (GAUZE/BANDAGES/DRESSINGS) ×3 IMPLANT
BANDAGE ACE 6X5 VEL STRL LF (GAUZE/BANDAGES/DRESSINGS) ×3 IMPLANT
BANDAGE ESMARK 6X9 LF (GAUZE/BANDAGES/DRESSINGS) ×1 IMPLANT
BLADE SAW RECIPROCATING 77.5 (BLADE) ×3 IMPLANT
BNDG CMPR 9X6 STRL LF SNTH (GAUZE/BANDAGES/DRESSINGS) ×1
BNDG ESMARK 6X9 LF (GAUZE/BANDAGES/DRESSINGS) ×3
BONE CEMENT SIMPLEX TOBRAMYCIN (Cement) ×9 IMPLANT
CAPT KNEE TOTAL 3 ×2 IMPLANT
CEMENT BONE SIMPLEX TOBRAMYCIN (Cement) ×3 IMPLANT
CHLORAPREP W/TINT 26ML (MISCELLANEOUS) ×6 IMPLANT
CUFF TOURN SGL QUICK 34 (TOURNIQUET CUFF) ×3
CUFF TRNQT CYL 34X4X40X1 (TOURNIQUET CUFF) ×1 IMPLANT
DECANTER SPIKE VIAL GLASS SM (MISCELLANEOUS) ×6 IMPLANT
DRAPE EXTREMITY T 121X128X90 (DRAPE) ×3 IMPLANT
DRAPE POUCH INSTRU U-SHP 10X18 (DRAPES) ×3 IMPLANT
DRAPE SHEET LG 3/4 BI-LAMINATE (DRAPES) ×6 IMPLANT
DRAPE U-SHAPE 47X51 STRL (DRAPES) ×3 IMPLANT
DRSG ADAPTIC 3X8 NADH LF (GAUZE/BANDAGES/DRESSINGS) ×3 IMPLANT
DRSG AQUACEL AG ADV 3.5X10 (GAUZE/BANDAGES/DRESSINGS) ×3 IMPLANT
DRSG TEGADERM 4X4.75 (GAUZE/BANDAGES/DRESSINGS) ×3 IMPLANT
ELECT PENCIL ROCKER SW 15FT (MISCELLANEOUS) ×3 IMPLANT
ELECT REM PT RETURN 9FT ADLT (ELECTROSURGICAL) ×3
ELECTRODE REM PT RTRN 9FT ADLT (ELECTROSURGICAL) ×1 IMPLANT
EVACUATOR 1/8 PVC DRAIN (DRAIN) ×3 IMPLANT
FACESHIELD WRAPAROUND (MASK) ×12 IMPLANT
FACESHIELD WRAPAROUND OR TEAM (MASK) ×4 IMPLANT
GAUZE SPONGE 4X4 12PLY STRL (GAUZE/BANDAGES/DRESSINGS) ×3 IMPLANT
GLOVE BIOGEL PI IND STRL 7.5 (GLOVE) IMPLANT
GLOVE BIOGEL PI IND STRL 8.5 (GLOVE) ×3 IMPLANT
GLOVE BIOGEL PI INDICATOR 7.5 (GLOVE) ×8
GLOVE BIOGEL PI INDICATOR 8.5 (GLOVE) ×6
GLOVE SURG SS PI 7.5 STRL IVOR (GLOVE) ×6 IMPLANT
GLOVE SURG SS PI 8.5 STRL IVOR (GLOVE) ×4
GLOVE SURG SS PI 8.5 STRL STRW (GLOVE) IMPLANT
GOWN SPEC L3 XXLG W/TWL (GOWN DISPOSABLE) ×3 IMPLANT
GOWN STRL REUS W/ TWL LRG LVL3 (GOWN DISPOSABLE) IMPLANT
GOWN STRL REUS W/TWL LRG LVL3 (GOWN DISPOSABLE) ×3
GOWN STRL REUS W/TWL XL LVL3 (GOWN DISPOSABLE) ×4 IMPLANT
HANDPIECE INTERPULSE COAX TIP (DISPOSABLE) ×3
HOOD PEEL AWAY FLYTE STAYCOOL (MISCELLANEOUS) ×6 IMPLANT
LIQUID BAND (GAUZE/BANDAGES/DRESSINGS) ×4 IMPLANT
MARKER SKIN DUAL TIP RULER LAB (MISCELLANEOUS) ×3 IMPLANT
NDL SPNL 18GX3.5 QUINCKE PK (NEEDLE) ×1 IMPLANT
NEEDLE SPNL 18GX3.5 QUINCKE PK (NEEDLE) ×3 IMPLANT
PAD CAST 4YDX4 CTTN HI CHSV (CAST SUPPLIES) IMPLANT
PADDING CAST COTTON 4X4 STRL (CAST SUPPLIES) ×3
PADDING CAST COTTON 6X4 STRL (CAST SUPPLIES) ×3 IMPLANT
POSITIONER SURGICAL ARM (MISCELLANEOUS) ×3 IMPLANT
SAW OSC TIP CART 19.5X105X1.3 (SAW) ×3 IMPLANT
SEALER BIPOLAR AQUA 6.0 (INSTRUMENTS) ×3 IMPLANT
SET HNDPC FAN SPRY TIP SCT (DISPOSABLE) ×1 IMPLANT
SET PAD KNEE POSITIONER (MISCELLANEOUS) ×3 IMPLANT
SOL PREP POV-IOD 4OZ 10% (MISCELLANEOUS) ×3 IMPLANT
SPONGE DRAIN TRACH 4X4 STRL 2S (GAUZE/BANDAGES/DRESSINGS) ×3 IMPLANT
SPONGE LAP 18X18 X RAY DECT (DISPOSABLE) ×3 IMPLANT
SUCTION FRAZIER HANDLE 12FR (TUBING) ×2
SUCTION TUBE FRAZIER 12FR DISP (TUBING) ×1 IMPLANT
SUT MNCRL AB 3-0 PS2 18 (SUTURE) ×3 IMPLANT
SUT MON AB 2-0 CT1 36 (SUTURE) ×6 IMPLANT
SUT VIC AB 1 CT1 27 (SUTURE) ×6
SUT VIC AB 1 CT1 27XBRD ANTBC (SUTURE) ×3 IMPLANT
SUT VIC AB 1 CT1 36 (SUTURE) ×5 IMPLANT
SUT VIC AB 2-0 CT1 27 (SUTURE) ×9
SUT VIC AB 2-0 CT1 TAPERPNT 27 (SUTURE) ×1 IMPLANT
SUT VLOC 180 0 24IN GS25 (SUTURE) ×3 IMPLANT
SYR 50ML LL SCALE MARK (SYRINGE) ×3 IMPLANT
TOWEL OR 17X26 10 PK STRL BLUE (TOWEL DISPOSABLE) ×6 IMPLANT
TOWEL OR NON WOVEN STRL DISP B (DISPOSABLE) ×3 IMPLANT
TOWER CARTRIDGE SMART MIX (DISPOSABLE) ×3 IMPLANT
TRAY CATH 16FR W/PLASTIC CATH (SET/KITS/TRAYS/PACK) ×2 IMPLANT
TRAY FOLEY CATH SILVER 16FR LF (SET/KITS/TRAYS/PACK) IMPLANT
WRAP KNEE MAXI GEL POST OP (GAUZE/BANDAGES/DRESSINGS) ×3 IMPLANT
YANKAUER SUCT BULB TIP 10FT TU (MISCELLANEOUS) ×3 IMPLANT

## 2016-01-21 NOTE — Anesthesia Preprocedure Evaluation (Signed)
Anesthesia Evaluation  Patient identified by MRN, date of birth, ID band Patient awake    Reviewed: Allergy & Precautions, NPO status , Patient's Chart, lab work & pertinent test results  Airway Mallampati: II  TM Distance: >3 FB Neck ROM: Full    Dental no notable dental hx.    Pulmonary neg pulmonary ROS, former smoker,    Pulmonary exam normal breath sounds clear to auscultation       Cardiovascular negative cardio ROS Normal cardiovascular exam Rhythm:Regular Rate:Normal     Neuro/Psych negative neurological ROS  negative psych ROS   GI/Hepatic Neg liver ROS, PUD,   Endo/Other  negative endocrine ROS  Renal/GU negative Renal ROS  negative genitourinary   Musculoskeletal negative musculoskeletal ROS (+)   Abdominal   Peds negative pediatric ROS (+)  Hematology negative hematology ROS (+)   Anesthesia Other Findings   Reproductive/Obstetrics negative OB ROS                             Anesthesia Physical Anesthesia Plan  ASA: II  Anesthesia Plan: General   Post-op Pain Management: GA combined w/ Regional for post-op pain   Induction: Intravenous  Airway Management Planned: Oral ETT  Additional Equipment:   Intra-op Plan:   Post-operative Plan: Extubation in OR  Informed Consent: I have reviewed the patients History and Physical, chart, labs and discussed the procedure including the risks, benefits and alternatives for the proposed anesthesia with the patient or authorized representative who has indicated his/her understanding and acceptance.   Dental advisory given  Plan Discussed with: CRNA and Surgeon  Anesthesia Plan Comments:         Anesthesia Quick Evaluation

## 2016-01-21 NOTE — Interval H&P Note (Signed)
History and Physical Interval Note:  01/21/2016 12:49 PM  Bob Holland  has presented today for surgery, with the diagnosis of djd right knee  The various methods of treatment have been discussed with the patient and family. After consideration of risks, benefits and other options for treatment, the patient has consented to  Procedure(s): RIGHT TOTAL KNEE ARTHROPLASTY WITH NAVIGATION (Right) as a surgical intervention .  The patient's history has been reviewed, patient examined, no change in status, stable for surgery.  I have reviewed the patient's chart and labs.  Questions were answered to the patient's satisfaction.     Demeco Ducksworth, Horald Pollen

## 2016-01-21 NOTE — Progress Notes (Signed)
Thigh high TED hose not available in patient size in Short Stay. Notified Dr. Lyla Glassing and he stated OK to use knee high.

## 2016-01-21 NOTE — Op Note (Addendum)
OPERATIVE REPORT  SURGEON: Rod Can, MD   ASSISTANT: Nehemiah Massed, PA-C.  PREOPERATIVE DIAGNOSIS: Right knee arthritis with severe stiffness and flexion contracture.   POSTOPERATIVE DIAGNOSIS: Right knee arthritis with severe stiffness and flexion contracture.   PROCEDURE: Right total knee arthroplasty.   IMPLANTS: Stryker Triathlon CR femur, size 8. Stryker universal tibia, size 8. X3 polyethelyene insert, size 9 mm, CR. 3 button asymmetric patella, size 40 mm. Simplex P bone cement.  ANESTHESIA:  General and Regional  ESTIMATED BLOOD LOSS: 50 mL.  ANTIBIOTICS: 900 mg clindamycin.  DRAINS: Medium HV x1.  COMPLICATIONS: None   CONDITION: PACU - hemodynamically stable.   BRIEF CLINICAL NOTE: Bob Holland is a 82 y.o. male with a long-standing history of Right knee arthritis. After failing conservative management, the patient was indicated for total knee arthroplasty. The risks, benefits, and alternatives to the procedure were explained, and the patient elected to proceed.  PROCEDURE IN DETAIL:  Block was placed in the holding room by anesthesia. The patient was taken the operating room and general anesthesia was induced. The patient was then positioned, a nonsterile tourniquet was placed, and the lower extremity was prepped and draped in the normal sterile surgical fashion. A time-out was called verifying side and site of surgery. The patient received IV antibiotics within 60 minutes of beginning the procedure. The extremity was exsanguinated with an Esmarch, and the tourniquet was inflated.  An anterior approach to the knee was performed utilizing a medial parapatellar arthrotomy. A medial release was performed and the patellar fat pad was excised. Stryker navigation was used to cut the distal femur perpendicular to the mechanical axis. A freehand patellar resection was  performed, and the patella was sized an prepared with 3 lug holes. His bone was noted to be fairly poor quality.   Nagivation was used to make a neutral proximal tibia resection, taking 9 mm of bone from the less affected lateral side with 3 degrees of slope. The menisci were excised. A spacer block was placed, and the alignment and balance in extension were confirmed.   The distal femur was sized using the 3-degree external rotation guide referencing the posterior femoral cortex. The appropriate 4-in-1 cutting block was pinned into place. Rotation was checked using Whiteside's line, the epicondylar axis, and then confirmed with a spacer block in flexion. The remaining femoral cuts were performed, taking care to protect the MCL.  The tibia was sized and the trial tray was pinned into place. The remaining trail components were inserted. The knee was stable to varus and valgus stress through a full range of motion. The patella tracked centrally, and the PCL was well balanced. The trial components were removed, and the proximal tibial surface was prepared. Small drill holes were made in the sclerotic subchondral bone.The cut bony surfaces were irrigated with pulse lavage. Final components were cemented into place and excess cement was cleared. The trial insert was placed, and the knee was brought into extension while the cement polymerized. Once the cement was hard, the knee was tested for a final time and found to be well balanced. The trial insert was exchanged for the real polyethylene insert.  The wound was copiously irrigated with a dilute betadine solution followed by normal saline with pulse lavage. Marcaine solution was injected into the periarticular soft tissue. The patellar tendon attachment on the tibia was reinforced with a 5.5 mm suture anchor given his poor bone quality. The wound was closed in layers using #1 Vicryl and V-Loc  for the fascia, 2-0 Vicryl for the subcutaneous fat, 2-0 Monocryl  for the deep dermal layer, 3-0 running Monocryl subcuticular Stitch, and Dermabond for the skin. Once the glue was fully dried, an Aquacell Ag and compressive dressing were applied. The tourniquet was let down, and the patient was transported to the recovery room in stable ondition. Sponge, needle, and instrument counts were correct at the end of the case x2. The patient tolerated the procedure well and there were no known complications.  Please note that a surgical assistant was a medical necessity for this procedure in order to perform it in a safe and expeditious manner. Surgical assistant was necessary to retract the ligaments and vital neurovascular structures to prevent injury to them and also necessary for proper positioning of the limb to allow for anatomic placement of the prosthesis.

## 2016-01-21 NOTE — Anesthesia Postprocedure Evaluation (Signed)
Anesthesia Post Note  Patient: Eduard Roux Pearse  Procedure(s) Performed: Procedure(s) (LRB): RIGHT TOTAL KNEE ARTHROPLASTY WITH NAVIGATION (Right)  Patient location during evaluation: PACU Anesthesia Type: General Level of consciousness: awake and alert Pain management: pain level controlled Vital Signs Assessment: post-procedure vital signs reviewed and stable Respiratory status: spontaneous breathing, nonlabored ventilation, respiratory function stable and patient connected to nasal cannula oxygen Cardiovascular status: blood pressure returned to baseline and stable Postop Assessment: no signs of nausea or vomiting Anesthetic complications: no    Last Vitals:  Filed Vitals:   01/21/16 1715 01/21/16 1730  BP: 133/70   Pulse: 75 81  Temp:    Resp: 19 17    Last Pain:  Filed Vitals:   01/21/16 1731  PainSc: 0-No pain        RLE Motor Response: Purposeful movement (01/21/16 1730) RLE Sensation: No numbness;No tingling (01/21/16 1730)      Ginger Leeth S

## 2016-01-21 NOTE — Discharge Instructions (Signed)
° °Dr. Maxi Rodas °Total Joint Specialist °[facility] °[address] °[place], [place] [address] °[phone] ° °TOTAL KNEE REPLACEMENT POSTOPERATIVE DIRECTIONS ° ° ° °Knee Rehabilitation, Guidelines Following Surgery  °Results after knee surgery are often greatly improved when you follow the exercise, range of motion and muscle strengthening exercises prescribed by your doctor. Safety measures are also important to protect the knee from further injury. Any time any of these exercises cause you to have increased pain or swelling in your knee joint, decrease the amount until you are comfortable again and slowly increase them. If you have problems or questions, call your caregiver or physical therapist for advice.  ° °WEIGHT BEARING °Weight bearing as tolerated with assist device (walker, cane, etc) as directed, use it as long as suggested by your surgeon or therapist, typically at least 4-6 weeks. ° °HOME CARE INSTRUCTIONS  °Remove items at home which could result in a fall. This includes throw rugs or furniture in walking pathways.  °Continue medications as instructed at time of discharge. °You may have some home medications which will be placed on hold until you complete the course of blood thinner medication.  °You may start showering once you are discharged home but do not submerge the incision under water. Just pat the incision dry and apply a dry gauze dressing on daily. °Walk with walker as instructed.  °You may resume a sexual relationship in one month or when given the OK by your doctor.  °· Use walker as long as suggested by your caregivers. °· Avoid periods of inactivity such as sitting longer than an hour when not asleep. This helps prevent blood clots.  °You may put full weight on your legs and walk as much as is comfortable.  °You may return to work once you are cleared by your doctor.  °Do not drive a car for 6 weeks or until released by you surgeon.  °· Do not drive  while taking narcotics.  °Wear the elastic stockings for three weeks following surgery during the day but you may remove then at night. °Make sure you keep all of your appointments after your operation with all of your doctors and caregivers. You should call the office at the above phone number and make an appointment for approximately two weeks after the date of your surgery. °Do not remove your surgical dressing. The dressing is waterproof; you may take showers in 3 days, but do not take tub baths or submerge the dressing. °Please pick up a stool softener and laxative for home use as long as you are requiring pain medications. °· ICE to the affected knee every three hours for 30 minutes at a time and then as needed for pain and swelling.  Continue to use ice on the knee for pain and swelling from surgery. You may notice swelling that will progress down to the foot and ankle.  This is normal after surgery.  Elevate the leg when you are not up walking on it.   °It is important for you to complete the blood thinner medication as prescribed by your doctor. °· Continue to use the breathing machine which will help keep your temperature down.  It is common for your temperature to cycle up and down following surgery, especially at night when you are not up moving around and exerting yourself.  The breathing machine keeps your lungs expanded and your temperature down. ° °RANGE OF MOTION AND STRENGTHENING EXERCISES  °Rehabilitation of the knee is important following   a knee injury or an operation. After just a few days of immobilization, the muscles of the thigh which control the knee become weakened and shrink (atrophy). Knee exercises are designed to build up the tone and strength of the thigh muscles and to improve knee motion. Often times heat used for twenty to thirty minutes before working out will loosen up your tissues and help with improving the range of motion but do not use heat for the first two weeks following  surgery. These exercises can be done on a training (exercise) mat, on the floor, on a table or on a bed. Use what ever works the best and is most comfortable for you Knee exercises include:  °Leg Lifts - While your knee is still immobilized in a splint or cast, you can do straight leg raises. Lift the leg to 60 degrees, hold for 3 sec, and slowly lower the leg. Repeat 10-20 times 2-3 times daily. Perform this exercise against resistance later as your knee gets better.  °Quad and Hamstring Sets - Tighten up the muscle on the front of the thigh (Quad) and hold for 5-10 sec. Repeat this 10-20 times hourly. Hamstring sets are done by pushing the foot backward against an object and holding for 5-10 sec. Repeat as with quad sets.  °A rehabilitation program following serious knee injuries can speed recovery and prevent re-injury in the future due to weakened muscles. Contact your doctor or a physical therapist for more information on knee rehabilitation.  ° °SKILLED REHAB INSTRUCTIONS: °If the patient is transferred to a skilled rehab facility following release from the hospital, a list of the current medications will be sent to the facility for the patient to continue.  When discharged from the skilled rehab facility, please have the facility set up the patient's Home Health Physical Therapy prior to being released. Also, the skilled facility will be responsible for providing the patient with their medications at time of release from the facility to include their pain medication, the muscle relaxants, and their blood thinner medication. If the patient is still at the rehab facility at time of the two week follow up appointment, the skilled rehab facility will also need to assist the patient in arranging follow up appointment in our office and any transportation needs. ° °MAKE SURE YOU:  °Understand these instructions.  °Will watch your condition.  °Will get help right away if you are not doing well or get worse.   ° ° °Pick up stool softner and laxative for home use following surgery while on pain medications. °Do NOT remove your dressing. You may shower.  °Do not take tub baths or submerge incision under water. °May shower starting three days after surgery. °Please use a clean towel to pat the incision dry following showers. °Continue to use ice for pain and swelling after surgery. °Do not use any lotions or creams on the incision until instructed by your surgeon. ° °

## 2016-01-21 NOTE — H&P (View-Only) (Signed)
TOTAL KNEE ADMISSION H&P  Patient is being admitted for right total knee arthroplasty.  Subjective:  Chief Complaint:right knee pain.  HPI: Bob Holland, 82 y.o. male, has a history of pain and functional disability in the right knee due to arthritis and has failed non-surgical conservative treatments for greater than 12 weeks to includeNSAID's and/or analgesics, corticosteriod injections, flexibility and strengthening excercises, use of assistive devices, weight reduction as appropriate and activity modification.  Onset of symptoms was gradual, starting >10 years ago with rapidlly worsening course since that time. The patient noted no past surgery on the right knee(s).  Patient currently rates pain in the right knee(s) at 10 out of 10 with activity. Patient has night pain, worsening of pain with activity and weight bearing, pain that interferes with activities of daily living, pain with passive range of motion and crepitus.  Patient has evidence of subchondral cysts, subchondral sclerosis, periarticular osteophytes, joint subluxation and joint space narrowing by imaging studies. There is no active infection.  There are no active problems to display for this patient.  Past Medical History  Diagnosis Date  . Gastric ulcer     Past Surgical History  Procedure Laterality Date  . Joint replacement    . Total knee arthroplasty       (Not in a hospital admission) Allergies  Allergen Reactions  . Aspirin Other (See Comments)    REACTION: Cannot take due to bleeding ulcers  . Cephalexin Swelling and Rash    Social History  Substance Use Topics  . Smoking status: Never Smoker   . Smokeless tobacco: Not on file  . Alcohol Use: No    No family history on file.   Review of Systems  Constitutional: Negative.   HENT: Negative.   Eyes: Negative.   Respiratory: Negative.   Cardiovascular: Negative.   Gastrointestinal: Negative.   Musculoskeletal: Positive for joint pain.  Skin: Negative.    Neurological: Negative.   Endo/Heme/Allergies: Negative.   Psychiatric/Behavioral: Negative.     Objective:  Physical Exam  Constitutional: He is oriented to person, place, and time. He appears well-developed and well-nourished.  HENT:  Head: Normocephalic and atraumatic.  Eyes: Conjunctivae and EOM are normal. Pupils are equal, round, and reactive to light.  Neck: Normal range of motion. Neck supple.  Cardiovascular: Normal rate, regular rhythm and intact distal pulses.   Respiratory: Effort normal and breath sounds normal. No respiratory distress.  GI: Soft. Bowel sounds are normal. He exhibits no distension.  Genitourinary:  deferred  Musculoskeletal:       Right knee: He exhibits decreased range of motion and LCL laxity. Tenderness found. Medial joint line tenderness noted.  ROM 18-80. 2+ DP  Neurological: He is alert and oriented to person, place, and time. He has normal reflexes.  Skin: Skin is warm and dry.  Psychiatric: He has a normal mood and affect. His behavior is normal. Judgment and thought content normal.    Vital signs in last 24 hours: @VSRANGES @  Labs:   Estimated body mass index is 32.54 kg/(m^2) as calculated from the following:   Height as of 08/24/12: 6' (1.829 m).   Weight as of 08/24/12: 108.863 kg (240 lb).   Imaging Review Plain radiographs demonstrate severe degenerative joint disease of the right knee(s). The overall alignment issignificant varus. The bone quality appears to be fair for age and reported activity level.  Assessment/Plan:  End stage arthritis, right knee   The patient history, physical examination, clinical judgment of the provider  and imaging studies are consistent with end stage degenerative joint disease of the right knee(s) and total knee arthroplasty is deemed medically necessary. The treatment options including medical management, injection therapy arthroscopy and arthroplasty were discussed at length. The risks and  benefits of total knee arthroplasty were presented and reviewed. The risks due to aseptic loosening, infection, stiffness, patella tracking problems, thromboembolic complications and other imponderables were discussed. The patient acknowledged the explanation, agreed to proceed with the plan and consent was signed. Patient is being admitted for inpatient treatment for surgery, pain control, PT, OT, prophylactic antibiotics, VTE prophylaxis, progressive ambulation and ADL's and discharge planning. The patient is planning to be discharged home with home health services

## 2016-01-21 NOTE — Anesthesia Procedure Notes (Addendum)
Anesthesia Regional Block:  Femoral nerve block  Pre-Anesthetic Checklist: ,, timeout performed, Correct Patient, Correct Site, Correct Laterality, Correct Procedure, Correct Position, site marked, Risks and benefits discussed,  Surgical consent,  Pre-op evaluation,  At surgeon's request and post-op pain management  Laterality: Right  Prep: chloraprep       Needles:  Injection technique: Single-shot  Needle Type: Echogenic Needle     Needle Length: 9cm 9 cm Needle Gauge: 21 and 21 G    Additional Needles:  Procedures: ultrasound guided (picture in chart) Femoral nerve block Narrative:  Injection made incrementally with aspirations every 5 mL.  Performed by: Personally   Additional Notes: Patient tolerated the procedure well without complications   Procedure Name: Intubation Date/Time: 01/21/2016 1:01 PM Performed by: Ofilia Neas Pre-anesthesia Checklist: Patient identified, Timeout performed, Emergency Drugs available, Suction available and Patient being monitored Patient Re-evaluated:Patient Re-evaluated prior to inductionOxygen Delivery Method: Circle system utilized Preoxygenation: Pre-oxygenation with 100% oxygen Intubation Type: IV induction Ventilation: Mask ventilation without difficulty Laryngoscope Size: Mac and 4 Grade View: Grade II Tube type: Oral Tube size: 7.5 mm Number of attempts: 1 Airway Equipment and Method: Stylet Placement Confirmation: ETT inserted through vocal cords under direct vision,  positive ETCO2 and breath sounds checked- equal and bilateral Secured at: 22 cm Tube secured with: Tape Dental Injury: Teeth and Oropharynx as per pre-operative assessment

## 2016-01-21 NOTE — Transfer of Care (Signed)
Immediate Anesthesia Transfer of Care Note  Patient: Bob Holland  Procedure(s) Performed: Procedure(s): RIGHT TOTAL KNEE ARTHROPLASTY WITH NAVIGATION (Right)  Patient Location: PACU  Anesthesia Type:General  Level of Consciousness: awake, oriented, patient cooperative, lethargic and responds to stimulation  Airway & Oxygen Therapy: Patient Spontanous Breathing and Patient connected to face mask oxygen  Post-op Assessment: Report given to RN, Post -op Vital signs reviewed and stable and Patient moving all extremities  Post vital signs: Reviewed and stable  Last Vitals:  Filed Vitals:   01/21/16 1713 01/21/16 1715  BP: 132/73   Pulse: 73 75  Temp: 36.9 C   Resp: 16 19    Complications: No apparent anesthesia complications

## 2016-01-22 LAB — CBC
HCT: 38.5 % — ABNORMAL LOW (ref 39.0–52.0)
HEMOGLOBIN: 12.9 g/dL — AB (ref 13.0–17.0)
MCH: 29.9 pg (ref 26.0–34.0)
MCHC: 33.5 g/dL (ref 30.0–36.0)
MCV: 89.1 fL (ref 78.0–100.0)
PLATELETS: 186 10*3/uL (ref 150–400)
RBC: 4.32 MIL/uL (ref 4.22–5.81)
RDW: 13.8 % (ref 11.5–15.5)
WBC: 11 10*3/uL — ABNORMAL HIGH (ref 4.0–10.5)

## 2016-01-22 LAB — BASIC METABOLIC PANEL
Anion gap: 10 (ref 5–15)
BUN: 19 mg/dL (ref 6–20)
CALCIUM: 8.5 mg/dL — AB (ref 8.9–10.3)
CHLORIDE: 107 mmol/L (ref 101–111)
CO2: 23 mmol/L (ref 22–32)
CREATININE: 0.94 mg/dL (ref 0.61–1.24)
GFR calc Af Amer: >60 mL/min (ref >60)
GFR calc non Af Amer: >60 mL/min (ref >60)
Glucose, Bld: 164 mg/dL — ABNORMAL HIGH (ref 65–99)
Potassium: 4.2 mmol/L (ref 3.5–5.1)
SODIUM: 140 mmol/L (ref 135–145)

## 2016-01-22 MED ORDER — DOCUSATE SODIUM 100 MG PO CAPS: 100.0000 mg | ORAL_CAPSULE | Freq: Two times a day (BID) | ORAL | Status: DC

## 2016-01-22 MED ORDER — HYDROCODONE-ACETAMINOPHEN 5-325 MG PO TABS: 1.0000 | ORAL_TABLET | ORAL | Status: DC | PRN

## 2016-01-22 MED ORDER — SENNA 8.6 MG PO TABS: 2.0000 | ORAL_TABLET | Freq: Every day | ORAL | Status: DC

## 2016-01-22 MED ORDER — ASPIRIN 81 MG PO TABS: 81.0000 mg | ORAL_TABLET | Freq: Two times a day (BID) | ORAL | Status: DC

## 2016-01-22 MED ORDER — ONDANSETRON HCL 4 MG PO TABS: 4.0000 mg | ORAL_TABLET | Freq: Four times a day (QID) | ORAL | Status: DC | PRN

## 2016-01-22 MED ORDER — FAMOTIDINE 20 MG PO TABS: 20.0000 mg | ORAL_TABLET | Freq: Two times a day (BID) | ORAL | Status: DC

## 2016-01-22 NOTE — Evaluation (Addendum)
Physical Therapy Evaluation Patient Details Name: Bob Holland MRN: [id number] DOB: [birth year] Today's Date: 01/22/2016   History of Present Illness  R TKR; s/p L TKR 1998  Clinical Impression  Pt s/p R TKR presents with decreased R LE strength/ROM and post op pain limiting functional mobility.  Pt should progress to dc home with family assist and HHPT follow up.    Follow Up Recommendations Home health PT    Equipment Recommendations  None recommended by PT    Recommendations for Other Services OT consult     Precautions / Restrictions Precautions Precautions: Knee;Fall Restrictions Weight Bearing Restrictions: No Other Position/Activity Restrictions: WBAT      Mobility  Bed Mobility Overal bed mobility: Needs Assistance Bed Mobility: Supine to Sit     Supine to sit: Min guard     General bed mobility comments: cues for sequencing and use of L LE to self assist  Transfers Overall transfer level: Needs assistance Equipment used: Rolling walker (2 wheeled) Transfers: Sit to/from Stand Sit to Stand: Min assist         General transfer comment: cues for LE management and use of UEs to self assist  Ambulation/Gait Ambulation/Gait assistance: Min assist Ambulation Distance (Feet): 56 Feet Assistive device: Rolling walker (2 wheeled) Gait Pattern/deviations: Step-to pattern;Decreased step length - right;Decreased step length - left;Shuffle;Trunk flexed Gait velocity: decr Gait velocity interpretation: Below normal speed for age/gender General Gait Details: cues for sequence, posture and position from [employer]            Wheelchair Mobility    Modified Rankin (Stroke Patients Only)       Balance Overall balance assessment: Needs assistance Sitting-balance support: No upper extremity supported;Feet supported Sitting balance-Leahy Scale: Good     Standing balance support: Bilateral upper extremity supported Standing balance-Leahy Scale: Fair                                Pertinent Vitals/Pain Pain Assessment: No/denies pain    Home Living Family/patient expects to be discharged to:: Private residence Living Arrangements: Spouse/significant other Available Help at Discharge: Family Type of Home: House Home Access: Stairs to enter Entrance Stairs-Rails: None Entrance Stairs-Number of Steps: 4 Home Layout: One level Home Equipment: Environmental consultant - 2 wheels;Cane - single point;Crutches Additional Comments: Pt reports dtr is Therapist, sports and son is off from mission work and can assist    Prior Function Level of Independence: Independent with assistive device(s)         Comments: Pt states he used bil canes to ambulate     Hand Dominance        Extremity/Trunk Assessment   Upper Extremity Assessment: Overall WFL for tasks assessed           Lower Extremity Assessment: RLE deficits/detail;LLE deficits/detail RLE Deficits / Details: 3/5 quads with IND SLR, AAROM at knee -10 - 60 LLE Deficits / Details: Strength WFL but with ROM at knee ltd to ~90 and at hip to ~ 90 flex  Cervical / Trunk Assessment: Kyphotic  Communication   Communication: No difficulties  Cognition Arousal/Alertness: Awake/alert Behavior During Therapy: WFL for tasks assessed/performed Overall Cognitive Status: Within Functional Limits for tasks assessed                      General Comments      Exercises Total Joint Exercises Ankle Circles/Pumps: AROM;Both;15 reps;Supine Quad Sets: AROM;Both;10 reps;Supine  Heel Slides: AAROM;15 reps;Supine;Right Straight Leg Raises: AAROM;AROM;10 reps;Supine;Right      Assessment/Plan    PT Assessment Patient needs continued PT services  PT Diagnosis Difficulty walking   PT Problem List Decreased strength;Decreased range of motion;Decreased activity tolerance;Decreased balance;Decreased knowledge of use of DME;Pain  PT Treatment Interventions DME instruction;Gait training;Stair  training;Functional mobility training;Therapeutic activities;Therapeutic exercise;Patient/family education   PT Goals (Current goals can be found in the Care Plan section) Acute Rehab PT Goals Patient Stated Goal: Home PT Goal Formulation: With patient Time For Goal Achievement: 01/25/16 Potential to Achieve Goals: Good    Frequency 7X/week   Barriers to discharge        Co-evaluation               End of Session Equipment Utilized During Treatment: Gait belt Activity Tolerance: Patient tolerated treatment well Patient left: in chair;with call bell/phone within reach;with chair alarm set Nurse Communication: Mobility status         Time: [id number] PT Time Calculation (min) (ACUTE ONLY): 42 min   Charges:   PT Evaluation $PT Eval Low Complexity: 1 Procedure PT Treatments $Gait Training: 8-22 mins $Therapeutic Exercise: 8-22 mins   PT G Codes:        Soleia Badolato 01-24-2016, 1:16 PM

## 2016-01-22 NOTE — Progress Notes (Signed)
Physical Therapy Treatment Patient Details Name: Bob Holland MRN: [id number] DOB: 13-Apr-1930 Today's Date: 01/22/2016    History of Present Illness R TKR; s/p L TKR 1998    PT Comments    Pt progressing with mobility and eager for dc home.  Reviewed therex and stairs this pm with son and wife present.  Follow Up Recommendations  Home health PT     Equipment Recommendations  None recommended by PT    Recommendations for Other Services OT consult     Precautions / Restrictions Precautions Precautions: Knee;Fall Restrictions Weight Bearing Restrictions: No Other Position/Activity Restrictions: WBAT    Mobility  Bed Mobility Overal bed mobility: Needs Assistance Bed Mobility: Supine to Sit;Sit to Supine     Supine to sit: Supervision Sit to supine: Supervision   General bed mobility comments: cues for sequencing and use of L LE to self assist  Transfers Overall transfer level: Needs assistance Equipment used: Rolling walker (2 wheeled) Transfers: Sit to/from Stand Sit to Stand: Min guard         General transfer comment: cues for LE management and use of UEs to self assist  Ambulation/Gait Ambulation/Gait assistance: Min guard Ambulation Distance (Feet): 50 Feet Assistive device: Rolling walker (2 wheeled) Gait Pattern/deviations: Step-to pattern;Decreased step length - right;Decreased step length - left;Shuffle;Trunk flexed Gait velocity: decr   General Gait Details: cues for sequence, posture and position from RW   Stairs Stairs: Yes Stairs assistance: Min assist Stair Management: No rails;Step to pattern;Backwards;Forwards;With walker;With crutches Number of Stairs: 6 General stair comments: 3 stairs with RW bkwd and 3 stairs with crutches fwd.  Cues for sequence and foot/RW/crutch placement.  Son present and written instructions provided  Wheelchair Mobility    Modified Rankin (Stroke Patients Only)       Balance                                     Cognition Arousal/Alertness: Awake/alert Behavior During Therapy: WFL for tasks assessed/performed Overall Cognitive Status: Within Functional Limits for tasks assessed                      Exercises Total Joint Exercises Ankle Circles/Pumps: AROM;Both;15 reps;Supine Quad Sets: AROM;Both;10 reps;Supine    General Comments        Pertinent Vitals/Pain Pain Assessment: 0-10 Pain Score: 5  Pain Location: R knee with activity Pain Descriptors / Indicators: Aching;Sore Pain Intervention(s): Limited activity within patient's tolerance;Monitored during session;Patient requesting pain meds-RN notified (Pt declines ice pack)    Home Living Family/patient expects to be discharged to:: Private residence Living Arrangements: Spouse/significant other Available Help at Discharge: Family Type of Home: House Home Access: Stairs to enter Entrance Stairs-Rails: None Home Layout: One level Home Equipment: Environmental consultant - 2 wheels;Cane - single point;Crutches;Bedside commode Additional Comments: Pt reports dtr is RN and son is off from mission work and can assist    Prior Function Level of Independence: Independent with assistive device(s)      Comments: Pt states he used bil canes to ambulate   PT Goals (current goals can now be found in the care plan section) Acute Rehab PT Goals Patient Stated Goal: home today PT Goal Formulation: With patient Time For Goal Achievement: 01/25/16 Potential to Achieve Goals: Good Progress towards PT goals: Progressing toward goals    Frequency  7X/week    PT Plan Current plan remains appropriate  Co-evaluation             End of Session Equipment Utilized During Treatment: Gait belt Activity Tolerance: Patient tolerated treatment well Patient left: in bed;with call bell/phone within reach;with family/visitor present     Time: 1330-1410 PT Time Calculation (min) (ACUTE ONLY): 40 min  Charges:  $Gait  Training: 8-22 mins $Therapeutic Activity: 8-22 mins                    G Codes:      Declan Adamson 02/17/16, 2:24 PM

## 2016-01-22 NOTE — Discharge Summary (Signed)
Physician Discharge Summary  Patient ID: Bob Holland MRN: [id number] DOB/AGE: [birth year] 82 y.o.  Admit date: 01/21/2016 Discharge date: 01/22/2016  Admission Diagnoses:  Primary osteoarthritis of right knee  Discharge Diagnoses:  Principal Problem:   Primary osteoarthritis of right knee   Past Medical History  Diagnosis Date  . Gastric ulcer      x3  last episode 3 yrs ago  . Arthritis   . History of skin cancer   . Nocturia   . Cancer ([place])     hx skin cancer    Surgeries: Procedure(s): RIGHT TOTAL KNEE ARTHROPLASTY WITH NAVIGATION on 01/21/2016   Consultants (if any):    Discharged Condition: Improved  Hospital Course: Bob Holland is an 82 y.o. male who was admitted 01/21/2016 with a diagnosis of Primary osteoarthritis of right knee and went to the operating room on 01/21/2016 and underwent the above named procedures.    He was given perioperative antibiotics:      Anti-infectives    Start     Dose/Rate Route Frequency Ordered Stop   01/21/16 2000  clindamycin (CLEOCIN) IVPB 600 mg     600 mg 100 mL/hr over 30 Minutes Intravenous Every 6 hours 01/21/16 1759 01/22/16 0759   01/21/16 1144  clindamycin (CLEOCIN) IVPB 900 mg     900 mg 100 mL/hr over 30 Minutes Intravenous On call to O.R. 01/21/16 1144 01/21/16 1255    .  He was given sequential compression devices, early ambulation, and ASA for DVT prophylaxis.  He benefited maximally from the hospital stay and there were no complications.    Recent vital signs:  Filed Vitals:   01/22/16 0235 01/22/16 0434  BP: 135/69 125/74  Pulse: 71 86  Temp: 97.5 F (36.4 C) 98.4 F (36.9 C)  Resp: 16 16    Recent laboratory studies:  Lab Results  Component Value Date   HGB 12.9* 01/22/2016   HGB 14.4 01/14/2016   Lab Results  Component Value Date   WBC 11.0* 01/22/2016   PLT 186 01/22/2016   Lab Results  Component Value Date   INR 1.21 01/14/2016   Lab Results  Component Value Date   NA 140  01/22/2016   K 4.2 01/22/2016   CL 107 01/22/2016   CO2 23 01/22/2016   BUN 19 01/22/2016   CREATININE 0.94 01/22/2016   GLUCOSE 164* 01/22/2016    Discharge Medications:     Medication List    TAKE these medications        aspirin 81 MG tablet  Take 1 tablet (81 mg total) by mouth 2 (two) times daily after a meal.     docusate sodium 100 MG capsule  Commonly known as:  COLACE  Take 1 capsule (100 mg total) by mouth 2 (two) times daily.     famotidine 20 MG tablet  Commonly known as:  PEPCID  Take 1 tablet (20 mg total) by mouth 2 (two) times daily.     HYDROcodone-acetaminophen 5-325 MG tablet  Commonly known as:  NORCO  Take 1-2 tablets by mouth every 4 (four) hours as needed for moderate pain.     ondansetron 4 MG tablet  Commonly known as:  ZOFRAN  Take 1 tablet (4 mg total) by mouth every 6 (six) hours as needed for nausea.     senna 8.6 MG Tabs tablet  Commonly known as:  SENOKOT  Take 2 tablets (17.2 mg total) by mouth at bedtime.  Diagnostic Studies: Dg Knee Right Port  01/21/2016  CLINICAL DATA:  Postop right knee surgery. EXAM: PORTABLE RIGHT KNEE - 1-2 VIEW COMPARISON:  None. FINDINGS: Patient is status post total knee replacement. Hardware appears intact and appropriately positioned. No evidence of surgical complicating feature. Drainage catheter projects over the suprapatellar bursa. Expected postsurgical changes seen within the overlying soft tissues. IMPRESSION: Postsurgical changes of a total knee replacement. No evidence of surgical complicating feature. Electronically Signed   By: Franki Cabot M.D.   On: 01/21/2016 17:41    Disposition: 01-Home or Self Care  Discharge Instructions    Call MD / Call 911    Complete by:  As directed   If you experience chest pain or shortness of breath, CALL 911 and be transported to the hospital emergency room.  If you develope a fever above 101 F, pus (white drainage) or increased drainage or redness at the  wound, or calf pain, call your surgeon's office.     Constipation Prevention    Complete by:  As directed   Drink plenty of fluids.  Prune juice may be helpful.  You may use a stool softener, such as Colace (over the counter) 100 mg twice a day.  Use MiraLax (over the counter) for constipation as needed.     Diet - low sodium heart healthy    Complete by:  As directed      Do not put a pillow under the knee. Place it under the heel.    Complete by:  As directed      Driving restrictions    Complete by:  As directed   No driving for 6 weeks     Increase activity slowly as tolerated    Complete by:  As directed      Lifting restrictions    Complete by:  As directed   No lifting for 6 weeks     TED hose    Complete by:  As directed   Use stockings (TED hose) for 2 weeks on both leg(s).  You may remove them at night for sleeping.           Follow-up Information    Follow up with Fawaz Borquez, Horald Pollen, MD. Schedule an appointment as soon as possible for a visit in 2 weeks.   Specialty:  Orthopedic Surgery   Why:  For wound re-check   Contact information:   [address] [address] [phone]        Signed: Elie Goody 01/22/2016, 7:26 AM

## 2016-01-22 NOTE — Addendum Note (Signed)
Addendum  created 01/22/16 0935 by Lollie Sails, CRNA   Modules edited: Charges VN

## 2016-01-22 NOTE — Care Management Note (Signed)
Case Management Note  Patient Details  Name: Bob Holland MRN: [id number] Date of Birth: 1930-06-18  Subjective/Objective:        Left total knee arthroplasty            Action/Plan: Discharge Planning: AVS reviewed: NCM spoke to pt and states he lives at home with wife, Bob Holland. States his adult son is here for six months and can assist with his care at home. He can afford his medications at home. Has wide RW at home and bedside commode. Offered choice/HH list provided for [facility]. Pt agreeable to [facility] for [facility].   PCP- Dione Housekeeper MD   Expected Discharge Date:  01/23/16               Expected Discharge Plan:  [place]  In-House Referral:  NA  Discharge planning Services  CM Consult  Post Acute Care Choice:  Home Health Choice offered to:     DME Arranged:  N/A DME Agency:  NA  HH Arranged:  PT HH Agency:  [facility]  Status of Service:  Completed, signed off  Medicare Important Message Given:    Date Medicare IM Given:    Medicare IM give by:    Date Additional Medicare IM Given:    Additional Medicare Important Message give by:     If discussed at [place] of Stay Meetings, dates discussed:    Additional Comments:  Erenest Rasher, RN 01/22/2016, 11:06 AM

## 2016-01-22 NOTE — Progress Notes (Signed)
Occupational Therapy Evaluation Patient Details Name: Bob Holland MRN: [id number] DOB: [birth year] Today's Date: 01/22/2016    History of Present Illness R TKR; s/p L TKR 1998   Clinical Impression   Patient s/p R TKR presents with decreased ADL independence. All OT education completed and patient questions answered. Patient will have family assistance at discharge. Has all needed DME. OT will sign off.    Follow Up Recommendations  No OT follow up;Supervision/Assistance - 24 hour    Equipment Recommendations  None recommended by OT    Recommendations for Other Services       Precautions / Restrictions Precautions Precautions: Knee;Fall Restrictions Weight Bearing Restrictions: No Other Position/Activity Restrictions: WBAT      Mobility Bed Mobility Overal bed mobility: Needs Assistance Bed Mobility: Supine to Sit     Supine to sit: Min guard       Transfers Overall transfer level: Needs assistance Equipment used: Rolling walker (2 wheeled) Transfers: Sit to/from Stand Sit to Stand: Min guard             Balance                                       ADL Overall ADL's : Needs assistance/impaired Eating/Feeding: Independent;Sitting   Grooming: Supervision/safety;Min guard;Standing           Upper Body Dressing : Set up;Sitting   Lower Body Dressing: Moderate assistance;Sit to/from stand   Toilet Transfer: Min guard;Ambulation;Regular Toilet;BSC;RW   Toileting- Clothing Manipulation and Hygiene: Min guard       Functional mobility during ADLs: Min guard;Rolling walker General ADL Comments: Patient reports wife will assist with LB self-care as she did PTA. Patient has 3 in 1 for home use. Takes tub baths at baseline; is aware he is not allowed to do that right now and plans to sponge bathe.     Vision     Perception     Praxis      Pertinent Vitals/Pain Pain Assessment: 0-10 Pain Score: 3  Pain Location: R  knee Pain Descriptors / Indicators: Tightness;Sore Pain Intervention(s): Limited activity within patient's tolerance;Monitored during session;Repositioned     Hand Dominance Right   Extremity/Trunk Assessment Upper Extremity Assessment Upper Extremity Assessment: Overall WFL for tasks assessed      Cervical / Trunk Assessment Cervical / Trunk Assessment: Kyphotic   Communication Communication Communication: No difficulties   Cognition Arousal/Alertness: Awake/alert Behavior During Therapy: WFL for tasks assessed/performed Overall Cognitive Status: Within Functional Limits for tasks assessed                     General Comments       Exercises      Shoulder Instructions      Home Living Family/patient expects to be discharged to:: Private residence Living Arrangements: Spouse/significant other Available Help at Discharge: Family Type of Home: House Home Access: Stairs to enter Technical brewer of Steps: 4 Entrance Stairs-Rails: None Home Layout: One level     Bathroom Shower/Tub: Teacher, Bob Holland years/pre: Standard Bathroom Accessibility: Yes How Accessible: Accessible via walker Home Equipment: [place] - 2 wheels;Cane - single point;Crutches;Bedside commode   Additional Comments: Pt reports dtr is RN and son is off from mission work and can assist      Prior Functioning/Environment Level of Independence: Independent with assistive device(s)        Comments:  Pt states he used bil canes to ambulate    OT Diagnosis: Acute pain   OT Problem List: Decreased strength;Decreased range of motion;Decreased knowledge of use of DME or AE;Pain   OT Treatment/Interventions:      OT Goals(Current goals can be found in the care plan section) Acute Rehab OT Goals Patient Stated Goal: home today OT Goal Formulation: All assessment and education complete, DC therapy  OT Frequency:     Barriers to D/C:            Co-evaluation               End of Session Equipment Utilized During Treatment: Rolling walker Nurse Communication: Mobility status  Activity Tolerance: Patient tolerated treatment well Patient left: in bed;with call bell/phone within reach;with bed alarm set   Time: 1254-1310 OT Time Calculation (min): 16 min Charges:  OT General Charges $OT Visit: 1 Procedure OT Evaluation $OT Eval Moderate Complexity: 1 Procedure G-Codes:    Bob Holland A 02/15/2016, 1:35 PM

## 2016-01-22 NOTE — Progress Notes (Signed)
   Subjective:  Patient reports pain as mild.  No c/o. Denies N/V/CP/SOB.  Objective:   VITALS:   Filed Vitals:   01/21/16 2100 01/21/16 2300 01/22/16 0235 01/22/16 0434  BP: 126/68 137/79 135/69 125/74  Pulse: 77 87 71 86  Temp: 98.2 F (36.8 C) 98.2 F (36.8 C) 97.5 F (36.4 C) 98.4 F (36.9 C)  TempSrc: Oral Oral Oral Oral  Resp: 16 16 16 16   Height:      Weight:      SpO2: 97% 98% 95% 94%    ABD soft Sensation intact distally Intact pulses distally Dorsiflexion/Plantar flexion intact Incision: dressing C/D/I Compartment soft HV scant ss  Lab Results  Component Value Date   WBC 11.0* 01/22/2016   HGB 12.9* 01/22/2016   HCT 38.5* 01/22/2016   MCV 89.1 01/22/2016   PLT 186 01/22/2016   BMET    Component Value Date/Time   NA 140 01/22/2016 0357   K 4.2 01/22/2016 0357   CL 107 01/22/2016 0357   CO2 23 01/22/2016 0357   GLUCOSE 164* 01/22/2016 0357   BUN 19 01/22/2016 0357   CREATININE 0.94 01/22/2016 0357   CALCIUM 8.5* 01/22/2016 0357   GFRNONAA >60 01/22/2016 0357   GFRAA >60 01/22/2016 0357     Assessment/Plan: 1 Day Post-Op   Active Problems:   Primary osteoarthritis of right knee   WBAT with walker DVT ppx: out on ASA 81 PO BID PO pain control PT/OT D/C HV drain Dispo: D/C home after clears therapy, likely today   Elie Goody 01/22/2016, 7:20 AM   Rod Can, MD Cell [phone]

## 2016-02-01 NOTE — Addendum Note (Signed)
Addendum  created 02/01/16 0857 by Myrtie Soman, MD   Modules edited: Anesthesia Blocks and Procedures, Clinical Notes   Clinical Notes:  File: [id number]

## 2017-10-27 ENCOUNTER — Ambulatory Visit: Payer: Self-pay | Admitting: Orthopedic Surgery

## 2017-11-01 ENCOUNTER — Ambulatory Visit: Payer: Self-pay | Admitting: Orthopedic Surgery

## 2017-11-01 NOTE — H&P (View-Only) (Signed)
TOTAL HIP ADMISSION H&P  Patient is admitted for left total hip arthroplasty.  Subjective:  Chief Complaint: left hip pain  HPI: Bob Holland, 83 y.o. male, has a history of pain and functional disability in the left hip(s) due to arthritis and patient has failed non-surgical conservative treatments for greater than 12 weeks to include NSAID's and/or analgesics, flexibility and strengthening excercises, supervised PT with diminished ADL's post treatment, use of assistive devices, weight reduction as appropriate and activity modification.  Onset of symptoms was gradual starting 3 years ago with gradually worsening course since that time.The patient noted no past surgery on the left hip(s).  Patient currently rates pain in the left hip at 10 out of 10 with activity. Patient has night pain, worsening of pain with activity and weight bearing, trendelenberg gait, pain that interfers with activities of daily living, pain with passive range of motion and crepitus. Patient has evidence of subchondral cysts, subchondral sclerosis, periarticular osteophytes and joint space narrowing by imaging studies. This condition presents safety issues increasing the risk of falls.   There is no current active infection.  Patient Active Problem List   Diagnosis Date Noted  . Primary osteoarthritis of right knee 01/21/2016   Past Medical History:  Diagnosis Date  . Arthritis   . Cancer ([place])    hx skin cancer  . Gastric ulcer     x3  last episode 3 yrs ago  . History of skin cancer   . Nocturia     Past Surgical History:  Procedure Laterality Date  . JOINT REPLACEMENT    . KNEE ARTHROPLASTY Right 01/21/2016   Procedure: RIGHT TOTAL KNEE ARTHROPLASTY WITH NAVIGATION;  Surgeon: Rod Can, MD;  Location: [facility];  Service: Orthopedics;  Laterality: Right;  . SHOULDER SURGERY     rt  . TOTAL KNEE ARTHROPLASTY  1998   left  . WRIST SURGERY     left    Current Outpatient Medications  Medication Sig  Dispense Refill Last Dose  . ibuprofen (ADVIL,MOTRIN) 200 MG tablet Take 200 mg by mouth daily as needed (for pain.).      No current facility-administered medications for this visit.    Allergies  Allergen Reactions  . Aspirin Other (See Comments)    REACTION: Cannot take due to bleeding ulcers  . Cephalexin Swelling and Rash    Social History   Tobacco Use  . Smoking status: Former Smoker    Last attempt to quit: 01/13/1989    Years since quitting: 28.8  Substance Use Topics  . Alcohol use: No    No family history on file.   Review of Systems  Constitutional: Negative.   HENT: Positive for hearing loss.   Eyes: Negative.   Respiratory: Negative.   Cardiovascular: Negative.   Gastrointestinal: Positive for constipation and heartburn.  Genitourinary: Negative.   Musculoskeletal: Positive for joint pain.  Skin: Negative.   Neurological: Negative.   Endo/Heme/Allergies: Negative.     Objective:  Physical Exam  Vitals reviewed. Constitutional: He is oriented to person, place, and time. He appears well-developed and well-nourished.  HENT:  Head: Normocephalic and atraumatic.  Eyes: Conjunctivae and EOM are normal. Pupils are equal, round, and reactive to light.  Neck: Normal range of motion. Neck supple.  Cardiovascular: Normal rate, regular rhythm and intact distal pulses.  Respiratory: Effort normal. No respiratory distress.  GI: Soft. He exhibits no distension.  Genitourinary:  Genitourinary Comments: deferred  Musculoskeletal:       Left hip:  He exhibits decreased range of motion and tenderness.  Neurological: He is alert and oriented to person, place, and time. He has normal reflexes.  Skin: Skin is warm and dry.  Psychiatric: He has a normal mood and affect. His behavior is normal. Judgment and thought content normal.    Vital signs in last 24 hours: @VSRANGES @  Labs:   Estimated body mass index is 33.5 kg/m as calculated from the following:   Height  as of 01/21/16: 6' (1.829 m).   Weight as of 01/21/16: 112 kg (247 lb).   Imaging Review Plain radiographs demonstrate severe degenerative joint disease of the left hip(s). The bone quality appears to be adequate for age and reported activity level.  Assessment/Plan:  End stage arthritis, left hip(s)  The patient history, physical examination, clinical judgement of the provider and imaging studies are consistent with end stage degenerative joint disease of the left hip(s) and total hip arthroplasty is deemed medically necessary. The treatment options including medical management, injection therapy, arthroscopy and arthroplasty were discussed at length. The risks and benefits of total hip arthroplasty were presented and reviewed. The risks due to aseptic loosening, infection, stiffness, dislocation/subluxation,  thromboembolic complications and other imponderables were discussed.  The patient acknowledged the explanation, agreed to proceed with the plan and consent was signed. Patient is being admitted for inpatient treatment for surgery, pain control, PT, OT, prophylactic antibiotics, VTE prophylaxis, progressive ambulation and ADL's and discharge planning.The patient is planning to be discharged home with HEP

## 2017-11-01 NOTE — H&P (Signed)
TOTAL HIP ADMISSION H&P  Patient is admitted for left total hip arthroplasty.  Subjective:  Chief Complaint: left hip pain  HPI: Bob Holland, 83 y.o. male, has a history of pain and functional disability in the left hip(s) due to arthritis and patient has failed non-surgical conservative treatments for greater than 12 weeks to include NSAID's and/or analgesics, flexibility and strengthening excercises, supervised PT with diminished ADL's post treatment, use of assistive devices, weight reduction as appropriate and activity modification.  Onset of symptoms was gradual starting 3 years ago with gradually worsening course since that time.The patient noted no past surgery on the left hip(s).  Patient currently rates pain in the left hip at 10 out of 10 with activity. Patient has night pain, worsening of pain with activity and weight bearing, trendelenberg gait, pain that interfers with activities of daily living, pain with passive range of motion and crepitus. Patient has evidence of subchondral cysts, subchondral sclerosis, periarticular osteophytes and joint space narrowing by imaging studies. This condition presents safety issues increasing the risk of falls.   There is no current active infection.  Patient Active Problem List   Diagnosis Date Noted  . Primary osteoarthritis of right knee 01/21/2016   Past Medical History:  Diagnosis Date  . Arthritis   . Cancer ([place])    hx skin cancer  . Gastric ulcer     x3  last episode 3 yrs ago  . History of skin cancer   . Nocturia     Past Surgical History:  Procedure Laterality Date  . JOINT REPLACEMENT    . KNEE ARTHROPLASTY Right 01/21/2016   Procedure: RIGHT TOTAL KNEE ARTHROPLASTY WITH NAVIGATION;  Surgeon: Rod Can, MD;  Location: [facility];  Service: Orthopedics;  Laterality: Right;  . SHOULDER SURGERY     rt  . TOTAL KNEE ARTHROPLASTY  1998   left  . WRIST SURGERY     left    Current Outpatient Medications  Medication Sig  Dispense Refill Last Dose  . ibuprofen (ADVIL,MOTRIN) 200 MG tablet Take 200 mg by mouth daily as needed (for pain.).      No current facility-administered medications for this visit.    Allergies  Allergen Reactions  . Aspirin Other (See Comments)    REACTION: Cannot take due to bleeding ulcers  . Cephalexin Swelling and Rash    Social History   Tobacco Use  . Smoking status: Former Smoker    Last attempt to quit: 01/13/1989    Years since quitting: 28.8  Substance Use Topics  . Alcohol use: No    No family history on file.   Review of Systems  Constitutional: Negative.   HENT: Positive for hearing loss.   Eyes: Negative.   Respiratory: Negative.   Cardiovascular: Negative.   Gastrointestinal: Positive for constipation and heartburn.  Genitourinary: Negative.   Musculoskeletal: Positive for joint pain.  Skin: Negative.   Neurological: Negative.   Endo/Heme/Allergies: Negative.     Objective:  Physical Exam  Vitals reviewed. Constitutional: He is oriented to person, place, and time. He appears well-developed and well-nourished.  HENT:  Head: Normocephalic and atraumatic.  Eyes: Conjunctivae and EOM are normal. Pupils are equal, round, and reactive to light.  Neck: Normal range of motion. Neck supple.  Cardiovascular: Normal rate, regular rhythm and intact distal pulses.  Respiratory: Effort normal. No respiratory distress.  GI: Soft. He exhibits no distension.  Genitourinary:  Genitourinary Comments: deferred  Musculoskeletal:       Left hip:  He exhibits decreased range of motion and tenderness.  Neurological: He is alert and oriented to person, place, and time. He has normal reflexes.  Skin: Skin is warm and dry.  Psychiatric: He has a normal mood and affect. His behavior is normal. Judgment and thought content normal.    Vital signs in last 24 hours: @VSRANGES @  Labs:   Estimated body mass index is 33.5 kg/m as calculated from the following:   Height  as of 01/21/16: 6' (1.829 m).   Weight as of 01/21/16: 112 kg (247 lb).   Imaging Review Plain radiographs demonstrate severe degenerative joint disease of the left hip(s). The bone quality appears to be adequate for age and reported activity level.  Assessment/Plan:  End stage arthritis, left hip(s)  The patient history, physical examination, clinical judgement of the provider and imaging studies are consistent with end stage degenerative joint disease of the left hip(s) and total hip arthroplasty is deemed medically necessary. The treatment options including medical management, injection therapy, arthroscopy and arthroplasty were discussed at length. The risks and benefits of total hip arthroplasty were presented and reviewed. The risks due to aseptic loosening, infection, stiffness, dislocation/subluxation,  thromboembolic complications and other imponderables were discussed.  The patient acknowledged the explanation, agreed to proceed with the plan and consent was signed. Patient is being admitted for inpatient treatment for surgery, pain control, PT, OT, prophylactic antibiotics, VTE prophylaxis, progressive ambulation and ADL's and discharge planning.The patient is planning to be discharged home with HEP

## 2017-11-02 ENCOUNTER — Encounter (HOSPITAL_COMMUNITY): Payer: Self-pay | Admitting: *Deleted

## 2017-11-02 ENCOUNTER — Other Ambulatory Visit: Payer: Self-pay

## 2017-11-02 NOTE — Progress Notes (Signed)
   11/02/17 1858  OBSTRUCTIVE SLEEP APNEA  Have you ever been diagnosed with sleep apnea through a sleep study? No  Do you snore loudly (loud enough to be heard through closed doors)?  1  Do you often feel tired, fatigued, or sleepy during the daytime (such as falling asleep during driving or talking to someone)? 0  Has anyone observed you stop breathing during your sleep? 1  Do you have, or are you being treated for high blood pressure? 0  BMI more than 35 kg/m2? 1  Age > 92 (1-yes) 1  Male Gender (Yes=1) 1  Obstructive Sleep Apnea Score 5

## 2017-11-03 MED ORDER — SODIUM CHLORIDE 0.9 % IV SOLN: INTRAVENOUS | Status: DC

## 2017-11-03 MED ORDER — CLINDAMYCIN PHOSPHATE 900 MG/50ML IV SOLN
900.0000 mg | INTRAVENOUS | Status: AC
  Administered 2017-11-06: 900 mg via INTRAVENOUS
  Filled 2017-11-03: qty 50

## 2017-11-03 MED ORDER — TRANEXAMIC ACID 1000 MG/10ML IV SOLN
1000.0000 mg | INTRAVENOUS | Status: AC
  Administered 2017-11-06: 1000 mg via INTRAVENOUS
  Filled 2017-11-03: qty 1100

## 2017-11-03 MED ORDER — ACETAMINOPHEN 10 MG/ML IV SOLN
1000.0000 mg | INTRAVENOUS | Status: AC
  Administered 2017-11-06: 1000 mg via INTRAVENOUS
  Filled 2017-11-03: qty 100

## 2017-11-06 ENCOUNTER — Inpatient Hospital Stay (HOSPITAL_COMMUNITY): Payer: Medicare HMO

## 2017-11-06 ENCOUNTER — Encounter (HOSPITAL_COMMUNITY)
Admission: RE | Disposition: A | Discharge status: Home / Self Care | Payer: Self-pay | Source: Ambulatory Visit | Attending: Orthopedic Surgery

## 2017-11-06 ENCOUNTER — Inpatient Hospital Stay (HOSPITAL_COMMUNITY): Payer: Medicare HMO | Admitting: Anesthesiology

## 2017-11-06 ENCOUNTER — Other Ambulatory Visit: Payer: Self-pay

## 2017-11-06 ENCOUNTER — Inpatient Hospital Stay (HOSPITAL_COMMUNITY)
Admission: RE | Admit: 2017-11-06 | Discharge: 2017-11-07 | DRG: 470 | Disposition: A | Discharge status: Home / Self Care | Payer: Medicare HMO | Source: Ambulatory Visit | Attending: Orthopedic Surgery | Admitting: Orthopedic Surgery

## 2017-11-06 ENCOUNTER — Encounter (HOSPITAL_COMMUNITY): Payer: Self-pay

## 2017-11-06 DIAGNOSIS — M1612 Unilateral primary osteoarthritis, left hip: Principal | ICD-10-CM | POA: Diagnosis present

## 2017-11-06 DIAGNOSIS — Z09 Encounter for follow-up examination after completed treatment for conditions other than malignant neoplasm: Secondary | ICD-10-CM

## 2017-11-06 DIAGNOSIS — K219 Gastro-esophageal reflux disease without esophagitis: Secondary | ICD-10-CM | POA: Diagnosis present

## 2017-11-06 DIAGNOSIS — Z85828 Personal history of other malignant neoplasm of skin: Secondary | ICD-10-CM | POA: Diagnosis not present

## 2017-11-06 DIAGNOSIS — H9190 Unspecified hearing loss, unspecified ear: Secondary | ICD-10-CM | POA: Diagnosis present

## 2017-11-06 DIAGNOSIS — Z87891 Personal history of nicotine dependence: Secondary | ICD-10-CM | POA: Diagnosis not present

## 2017-11-06 DIAGNOSIS — Z8711 Personal history of peptic ulcer disease: Secondary | ICD-10-CM | POA: Diagnosis not present

## 2017-11-06 DIAGNOSIS — Z9181 History of falling: Secondary | ICD-10-CM

## 2017-11-06 DIAGNOSIS — Z96651 Presence of right artificial knee joint: Secondary | ICD-10-CM | POA: Diagnosis present

## 2017-11-06 DIAGNOSIS — M25552 Pain in left hip: Secondary | ICD-10-CM | POA: Diagnosis present

## 2017-11-06 HISTORY — DX: Adverse effect of unspecified anesthetic, initial encounter: T41.45XA

## 2017-11-06 HISTORY — DX: Gastro-esophageal reflux disease without esophagitis: K21.9

## 2017-11-06 HISTORY — PX: TOTAL HIP ARTHROPLASTY: SHX124

## 2017-11-06 HISTORY — DX: Unspecified injury of head, initial encounter: S09.90XA

## 2017-11-06 HISTORY — DX: Personal history of other diseases of the digestive system: Z87.19

## 2017-11-06 LAB — CBC
HEMATOCRIT: 45.5 % (ref 39.0–52.0)
Hemoglobin: 15.1 g/dL (ref 13.0–17.0)
MCH: 30.9 pg (ref 26.0–34.0)
MCHC: 33.2 g/dL (ref 30.0–36.0)
MCV: 93.2 fL (ref 78.0–100.0)
PLATELETS: 210 10*3/uL (ref 150–400)
RBC: 4.88 MIL/uL (ref 4.22–5.81)
RDW: 14.5 % (ref 11.5–15.5)
WBC: 6.7 10*3/uL (ref 4.0–10.5)

## 2017-11-06 LAB — BASIC METABOLIC PANEL
ANION GAP: 8 (ref 5–15)
BUN: 15 mg/dL (ref 6–20)
CHLORIDE: 105 mmol/L (ref 101–111)
CO2: 23 mmol/L (ref 22–32)
CREATININE: 0.93 mg/dL (ref 0.61–1.24)
Calcium: 8.9 mg/dL (ref 8.9–10.3)
GFR calc non Af Amer: >60 mL/min (ref >60)
GLUCOSE: 112 mg/dL — AB (ref 65–99)
Potassium: 4.1 mmol/L (ref 3.5–5.1)
Sodium: 136 mmol/L (ref 135–145)

## 2017-11-06 LAB — TYPE AND SCREEN
ABO/RH(D): O NEG
ANTIBODY SCREEN: NEGATIVE

## 2017-11-06 LAB — ABO/RH: ABO/RH(D): O NEG

## 2017-11-06 SURGERY — ARTHROPLASTY, HIP, TOTAL, ANTERIOR APPROACH
Anesthesia: General | Site: Hip | Laterality: Left

## 2017-11-06 MED ORDER — DIPHENHYDRAMINE HCL 12.5 MG/5ML PO ELIX: 12.5000 mg | ORAL_SOLUTION | ORAL | Status: DC | PRN

## 2017-11-06 MED ORDER — BUPIVACAINE-EPINEPHRINE (PF) 0.5% -1:200000 IJ SOLN
INTRAMUSCULAR | Status: AC
  Filled 2017-11-06: qty 30

## 2017-11-06 MED ORDER — ONDANSETRON HCL 4 MG/2ML IJ SOLN
4.0000 mg | Freq: Four times a day (QID) | INTRAMUSCULAR | Status: DC | PRN
  Administered 2017-11-06: 4 mg via INTRAVENOUS
  Filled 2017-11-06: qty 2

## 2017-11-06 MED ORDER — OMEPRAZOLE 20 MG PO CPDR: 20.0000 mg | DELAYED_RELEASE_CAPSULE | Freq: Every day | ORAL | 1 refills | Status: DC

## 2017-11-06 MED ORDER — OXYCODONE HCL 5 MG/5ML PO SOLN: 5.0000 mg | Freq: Once | ORAL | Status: AC | PRN

## 2017-11-06 MED ORDER — LIDOCAINE HCL (CARDIAC) 20 MG/ML IV SOLN
INTRAVENOUS | Status: DC | PRN
  Administered 2017-11-06: 40 mg via INTRAVENOUS

## 2017-11-06 MED ORDER — METHOCARBAMOL 500 MG PO TABS
ORAL_TABLET | ORAL | Status: AC
  Administered 2017-11-06: 500 mg via ORAL
  Filled 2017-11-06: qty 1

## 2017-11-06 MED ORDER — DOCUSATE SODIUM 100 MG PO CAPS
100.0000 mg | ORAL_CAPSULE | Freq: Two times a day (BID) | ORAL | Status: DC
  Administered 2017-11-06 – 2017-11-07 (×2): 100 mg via ORAL
  Filled 2017-11-06: qty 1

## 2017-11-06 MED ORDER — SODIUM CHLORIDE 0.9 % IJ SOLN
INTRAMUSCULAR | Status: DC | PRN
  Administered 2017-11-06: 30 mL via INTRAVENOUS

## 2017-11-06 MED ORDER — LACTATED RINGERS IV SOLN
INTRAVENOUS | Status: DC
  Administered 2017-11-06 (×2): via INTRAVENOUS

## 2017-11-06 MED ORDER — SUGAMMADEX SODIUM 200 MG/2ML IV SOLN
INTRAVENOUS | Status: DC | PRN
  Administered 2017-11-06: 200 mg via INTRAVENOUS

## 2017-11-06 MED ORDER — DOCUSATE SODIUM 100 MG PO CAPS: 100.0000 mg | ORAL_CAPSULE | Freq: Two times a day (BID) | ORAL | 3 refills | Status: DC

## 2017-11-06 MED ORDER — MENTHOL 3 MG MT LOZG: 1.0000 | LOZENGE | OROMUCOSAL | Status: DC | PRN

## 2017-11-06 MED ORDER — PROPOFOL 10 MG/ML IV BOLUS
INTRAVENOUS | Status: AC
  Filled 2017-11-06: qty 20

## 2017-11-06 MED ORDER — FENTANYL CITRATE (PF) 100 MCG/2ML IJ SOLN
INTRAMUSCULAR | Status: AC
  Administered 2017-11-06: 50 ug via INTRAVENOUS
  Filled 2017-11-06: qty 2

## 2017-11-06 MED ORDER — MIDAZOLAM HCL 2 MG/2ML IJ SOLN
1.0000 mg | Freq: Once | INTRAMUSCULAR | Status: AC
  Administered 2017-11-06: 1 mg via INTRAVENOUS

## 2017-11-06 MED ORDER — LIDOCAINE 2% (20 MG/ML) 5 ML SYRINGE
INTRAMUSCULAR | Status: AC
  Filled 2017-11-06: qty 5

## 2017-11-06 MED ORDER — BUPIVACAINE-EPINEPHRINE (PF) 0.5% -1:200000 IJ SOLN
INTRAMUSCULAR | Status: DC | PRN
  Administered 2017-11-06: 30 mL via PERINEURAL

## 2017-11-06 MED ORDER — POLYETHYLENE GLYCOL 3350 17 G PO PACK
17.0000 g | PACK | Freq: Every day | ORAL | Status: DC | PRN
Start: 2017-11-06 — End: 2017-11-07

## 2017-11-06 MED ORDER — SENNA 8.6 MG PO TABS: 2.0000 | ORAL_TABLET | Freq: Every day | ORAL | 3 refills | Status: DC

## 2017-11-06 MED ORDER — ONDANSETRON HCL 4 MG/2ML IJ SOLN
INTRAMUSCULAR | Status: DC | PRN
  Administered 2017-11-06: 4 mg via INTRAVENOUS

## 2017-11-06 MED ORDER — CHLORHEXIDINE GLUCONATE 4 % EX LIQD: 60.0000 mL | Freq: Once | CUTANEOUS | Status: DC

## 2017-11-06 MED ORDER — KETOROLAC TROMETHAMINE 30 MG/ML IJ SOLN
INTRAMUSCULAR | Status: AC
  Filled 2017-11-06: qty 1

## 2017-11-06 MED ORDER — SODIUM CHLORIDE 0.9 % IR SOLN
Status: DC | PRN
Start: 2017-11-06 — End: 2017-11-06
  Administered 2017-11-06: 3000 mL

## 2017-11-06 MED ORDER — ASPIRIN 81 MG PO TABS: 81.0000 mg | ORAL_TABLET | Freq: Two times a day (BID) | ORAL | 1 refills | Status: DC

## 2017-11-06 MED ORDER — KETOROLAC TROMETHAMINE 15 MG/ML IJ SOLN
INTRAMUSCULAR | Status: DC | PRN
  Administered 2017-11-06: 15 mg via INTRAVENOUS

## 2017-11-06 MED ORDER — ONDANSETRON HCL 4 MG/2ML IJ SOLN
INTRAMUSCULAR | Status: AC
  Filled 2017-11-06: qty 2

## 2017-11-06 MED ORDER — POVIDONE-IODINE 10 % EX SWAB: 2.0000 "application " | Freq: Once | CUTANEOUS | Status: DC

## 2017-11-06 MED ORDER — SODIUM CHLORIDE 0.9 % IV SOLN
INTRAVENOUS | Status: DC
  Administered 2017-11-06: 23:00:00 via INTRAVENOUS

## 2017-11-06 MED ORDER — FENTANYL CITRATE (PF) 100 MCG/2ML IJ SOLN
INTRAMUSCULAR | Status: DC | PRN
  Administered 2017-11-06: 25 ug via INTRAVENOUS
  Administered 2017-11-06: 50 ug via INTRAVENOUS
  Administered 2017-11-06: 75 ug via INTRAVENOUS
  Administered 2017-11-06 (×2): 50 ug via INTRAVENOUS

## 2017-11-06 MED ORDER — SENNA 8.6 MG PO TABS
2.0000 | ORAL_TABLET | Freq: Every day | ORAL | Status: DC
  Administered 2017-11-06: 17.2 mg via ORAL
  Filled 2017-11-06: qty 2

## 2017-11-06 MED ORDER — ACETAMINOPHEN 325 MG PO TABS
650.0000 mg | ORAL_TABLET | ORAL | Status: DC | PRN
  Filled 2017-11-06: qty 2

## 2017-11-06 MED ORDER — METOCLOPRAMIDE HCL 5 MG PO TABS: 5.0000 mg | ORAL_TABLET | Freq: Three times a day (TID) | ORAL | Status: DC | PRN

## 2017-11-06 MED ORDER — PHENOL 1.4 % MT LIQD: 1.0000 | OROMUCOSAL | Status: DC | PRN

## 2017-11-06 MED ORDER — DEXAMETHASONE SODIUM PHOSPHATE 4 MG/ML IJ SOLN
INTRAMUSCULAR | Status: DC | PRN
  Administered 2017-11-06: 4 mg via INTRAVENOUS

## 2017-11-06 MED ORDER — METOCLOPRAMIDE HCL 5 MG/ML IJ SOLN
5.0000 mg | Freq: Three times a day (TID) | INTRAMUSCULAR | Status: DC | PRN
  Administered 2017-11-06: 10 mg via INTRAVENOUS
  Filled 2017-11-06: qty 2

## 2017-11-06 MED ORDER — CLINDAMYCIN PHOSPHATE 600 MG/50ML IV SOLN
600.0000 mg | Freq: Four times a day (QID) | INTRAVENOUS | Status: AC
  Administered 2017-11-06 – 2017-11-07 (×2): 600 mg via INTRAVENOUS
  Filled 2017-11-06 (×2): qty 50

## 2017-11-06 MED ORDER — FENTANYL CITRATE (PF) 100 MCG/2ML IJ SOLN
25.0000 ug | INTRAMUSCULAR | Status: DC | PRN
  Administered 2017-11-06 (×3): 50 ug via INTRAVENOUS

## 2017-11-06 MED ORDER — PANTOPRAZOLE SODIUM 20 MG PO TBEC
20.0000 mg | DELAYED_RELEASE_TABLET | Freq: Every day | ORAL | Status: DC
  Administered 2017-11-07: 20 mg via ORAL
  Filled 2017-11-06 (×2): qty 1

## 2017-11-06 MED ORDER — ONDANSETRON HCL 4 MG PO TABS: 4.0000 mg | ORAL_TABLET | Freq: Four times a day (QID) | ORAL | Status: DC | PRN

## 2017-11-06 MED ORDER — OXYCODONE HCL 5 MG PO TABS
5.0000 mg | ORAL_TABLET | Freq: Once | ORAL | Status: AC | PRN
  Administered 2017-11-06: 5 mg via ORAL

## 2017-11-06 MED ORDER — FENTANYL CITRATE (PF) 100 MCG/2ML IJ SOLN
INTRAMUSCULAR | Status: AC
Start: 2017-11-06 — End: 2017-11-07
  Filled 2017-11-06: qty 2

## 2017-11-06 MED ORDER — PROPOFOL 10 MG/ML IV BOLUS
INTRAVENOUS | Status: DC | PRN
  Administered 2017-11-06: 125 mg via INTRAVENOUS

## 2017-11-06 MED ORDER — PHENYLEPHRINE HCL 10 MG/ML IJ SOLN
INTRAMUSCULAR | Status: DC | PRN
  Administered 2017-11-06 (×2): 80 ug via INTRAVENOUS

## 2017-11-06 MED ORDER — 0.9 % SODIUM CHLORIDE (POUR BTL) OPTIME
TOPICAL | Status: DC | PRN
  Administered 2017-11-06: 1000 mL

## 2017-11-06 MED ORDER — MIDAZOLAM HCL 2 MG/2ML IJ SOLN
INTRAMUSCULAR | Status: AC
  Administered 2017-11-06: 1 mg via INTRAVENOUS
  Filled 2017-11-06: qty 2

## 2017-11-06 MED ORDER — KETOROLAC TROMETHAMINE 30 MG/ML IJ SOLN
INTRAMUSCULAR | Status: DC | PRN
  Administered 2017-11-06: 30 mg via INTRAVENOUS

## 2017-11-06 MED ORDER — HYDROCODONE-ACETAMINOPHEN 5-325 MG PO TABS: 1.0000 | ORAL_TABLET | ORAL | 0 refills | Status: DC | PRN

## 2017-11-06 MED ORDER — OXYCODONE HCL 5 MG PO TABS
ORAL_TABLET | ORAL | Status: AC
  Administered 2017-11-06: 5 mg via ORAL
  Filled 2017-11-06: qty 1

## 2017-11-06 MED ORDER — TRANEXAMIC ACID 1000 MG/10ML IV SOLN
1000.0000 mg | Freq: Once | INTRAVENOUS | Status: AC
  Administered 2017-11-06: 1000 mg via INTRAVENOUS
  Filled 2017-11-06: qty 10

## 2017-11-06 MED ORDER — DEXAMETHASONE SODIUM PHOSPHATE 10 MG/ML IJ SOLN
INTRAMUSCULAR | Status: AC
  Filled 2017-11-06: qty 1

## 2017-11-06 MED ORDER — ALUM & MAG HYDROXIDE-SIMETH 200-200-20 MG/5ML PO SUSP: 30.0000 mL | ORAL | Status: DC | PRN

## 2017-11-06 MED ORDER — ONDANSETRON HCL 4 MG PO TABS: 4.0000 mg | ORAL_TABLET | Freq: Three times a day (TID) | ORAL | 0 refills | Status: DC | PRN

## 2017-11-06 MED ORDER — ONDANSETRON HCL 4 MG/2ML IJ SOLN: 4.0000 mg | Freq: Four times a day (QID) | INTRAMUSCULAR | Status: DC | PRN

## 2017-11-06 MED ORDER — HYDROCODONE-ACETAMINOPHEN 5-325 MG PO TABS
1.0000 | ORAL_TABLET | ORAL | Status: DC | PRN
  Administered 2017-11-06: 1 via ORAL
  Filled 2017-11-06: qty 1

## 2017-11-06 MED ORDER — FENTANYL CITRATE (PF) 250 MCG/5ML IJ SOLN
INTRAMUSCULAR | Status: AC
  Filled 2017-11-06: qty 5

## 2017-11-06 MED ORDER — HYDROCODONE-ACETAMINOPHEN 5-325 MG PO TABS: 2.0000 | ORAL_TABLET | ORAL | Status: DC | PRN

## 2017-11-06 MED ORDER — HYDROMORPHONE HCL 1 MG/ML IJ SOLN
0.5000 mg | INTRAMUSCULAR | Status: DC | PRN
  Administered 2017-11-06: 1 mg via INTRAVENOUS
  Filled 2017-11-06: qty 1

## 2017-11-06 MED ORDER — METHOCARBAMOL 500 MG PO TABS
500.0000 mg | ORAL_TABLET | Freq: Four times a day (QID) | ORAL | Status: DC | PRN
  Administered 2017-11-06 (×2): 500 mg via ORAL
  Filled 2017-11-06 (×2): qty 1

## 2017-11-06 MED ORDER — ACETAMINOPHEN 650 MG RE SUPP: 650.0000 mg | RECTAL | Status: DC | PRN

## 2017-11-06 MED ORDER — DEXAMETHASONE SODIUM PHOSPHATE 10 MG/ML IJ SOLN
10.0000 mg | Freq: Once | INTRAMUSCULAR | Status: AC
  Administered 2017-11-07: 10 mg via INTRAVENOUS

## 2017-11-06 MED ORDER — SODIUM CHLORIDE 0.9 % IR SOLN
Status: DC | PRN
  Administered 2017-11-06: 1000 mL

## 2017-11-06 MED ORDER — KETOROLAC TROMETHAMINE 15 MG/ML IJ SOLN
7.5000 mg | Freq: Four times a day (QID) | INTRAMUSCULAR | Status: DC
  Administered 2017-11-06 – 2017-11-07 (×3): 7.5 mg via INTRAVENOUS
  Filled 2017-11-06 (×3): qty 1

## 2017-11-06 MED ORDER — ROCURONIUM BROMIDE 100 MG/10ML IV SOLN
INTRAVENOUS | Status: DC | PRN
  Administered 2017-11-06: 50 mg via INTRAVENOUS
  Administered 2017-11-06: 20 mg via INTRAVENOUS

## 2017-11-06 MED ORDER — ASPIRIN 81 MG PO CHEW
81.0000 mg | CHEWABLE_TABLET | Freq: Two times a day (BID) | ORAL | Status: DC
  Administered 2017-11-06 – 2017-11-07 (×2): 81 mg via ORAL
  Filled 2017-11-06: qty 1

## 2017-11-06 MED ORDER — SUGAMMADEX SODIUM 200 MG/2ML IV SOLN
INTRAVENOUS | Status: AC
  Filled 2017-11-06: qty 2

## 2017-11-06 MED ORDER — METHOCARBAMOL 1000 MG/10ML IJ SOLN
500.0000 mg | Freq: Four times a day (QID) | INTRAVENOUS | Status: DC | PRN
  Filled 2017-11-06: qty 5

## 2017-11-06 SURGICAL SUPPLY — 53 items
ADH SKN CLS APL DERMABOND .7 (GAUZE/BANDAGES/DRESSINGS) ×2
ALCOHOL ISOPROPYL (RUBBING) (MISCELLANEOUS) ×2 IMPLANT
BLADE CLIPPER SURG (BLADE) IMPLANT
CAPT HIP TOTAL 2 ×1 IMPLANT
CHLORAPREP W/TINT 26ML (MISCELLANEOUS) ×2 IMPLANT
COVER SURGICAL LIGHT HANDLE (MISCELLANEOUS) ×2 IMPLANT
DERMABOND ADVANCED (GAUZE/BANDAGES/DRESSINGS) ×2
DERMABOND ADVANCED .7 DNX12 (GAUZE/BANDAGES/DRESSINGS) ×2 IMPLANT
DRAPE C-ARM 42X72 X-RAY (DRAPES) ×2 IMPLANT
DRAPE STERI IOBAN 125X83 (DRAPES) ×2 IMPLANT
DRAPE U-SHAPE 47X51 STRL (DRAPES) ×6 IMPLANT
DRSG AQUACEL AG ADV 3.5X10 (GAUZE/BANDAGES/DRESSINGS) ×2 IMPLANT
ELECT BLADE 4.0 EZ CLEAN MEGAD (MISCELLANEOUS) ×2
ELECT REM PT RETURN 9FT ADLT (ELECTROSURGICAL) ×2
ELECTRODE BLDE 4.0 EZ CLN MEGD (MISCELLANEOUS) ×1 IMPLANT
ELECTRODE REM PT RTRN 9FT ADLT (ELECTROSURGICAL) ×1 IMPLANT
EVACUATOR 1/8 PVC DRAIN (DRAIN) IMPLANT
GLOVE BIO SURGEON STRL SZ8.5 (GLOVE) ×4 IMPLANT
GLOVE BIOGEL PI IND STRL 8.5 (GLOVE) ×1 IMPLANT
GLOVE BIOGEL PI INDICATOR 8.5 (GLOVE) ×1
GOWN STRL REUS W/ TWL LRG LVL3 (GOWN DISPOSABLE) ×2 IMPLANT
GOWN STRL REUS W/TWL 2XL LVL3 (GOWN DISPOSABLE) ×2 IMPLANT
GOWN STRL REUS W/TWL LRG LVL3 (GOWN DISPOSABLE) ×4
HANDPIECE INTERPULSE COAX TIP (DISPOSABLE) ×2
HOOD PEEL AWAY FACE SHEILD DIS (HOOD) ×4 IMPLANT
KIT BASIN OR (CUSTOM PROCEDURE TRAY) ×2 IMPLANT
KIT ROOM TURNOVER OR (KITS) ×2 IMPLANT
MANIFOLD NEPTUNE II (INSTRUMENTS) ×2 IMPLANT
MARKER SKIN DUAL TIP RULER LAB (MISCELLANEOUS) ×4 IMPLANT
NDL SPNL 18GX3.5 QUINCKE PK (NEEDLE) ×1 IMPLANT
NEEDLE SPNL 18GX3.5 QUINCKE PK (NEEDLE) ×2 IMPLANT
NS IRRIG 1000ML POUR BTL (IV SOLUTION) ×2 IMPLANT
PACK TOTAL JOINT (CUSTOM PROCEDURE TRAY) ×2 IMPLANT
PACK UNIVERSAL I (CUSTOM PROCEDURE TRAY) ×2 IMPLANT
PAD ARMBOARD 7.5X6 YLW CONV (MISCELLANEOUS) ×4 IMPLANT
SAW OSC TIP CART 19.5X105X1.3 (SAW) ×2 IMPLANT
SEALER BIPOLAR AQUA 6.0 (INSTRUMENTS) IMPLANT
SET HNDPC FAN SPRY TIP SCT (DISPOSABLE) ×1 IMPLANT
SOL PREP POV-IOD 4OZ 10% (MISCELLANEOUS) ×2 IMPLANT
SUT ETHIBOND NAB CT1 #1 30IN (SUTURE) ×4 IMPLANT
SUT MNCRL AB 3-0 PS2 18 (SUTURE) ×2 IMPLANT
SUT MON AB 2-0 CT1 36 (SUTURE) ×2 IMPLANT
SUT VIC AB 1 CT1 27 (SUTURE) ×2
SUT VIC AB 1 CT1 27XBRD ANBCTR (SUTURE) ×1 IMPLANT
SUT VIC AB 2-0 CT1 27 (SUTURE) ×2
SUT VIC AB 2-0 CT1 TAPERPNT 27 (SUTURE) ×1 IMPLANT
SUT VLOC 180 0 24IN GS25 (SUTURE) ×2 IMPLANT
SYR 50ML LL SCALE MARK (SYRINGE) ×2 IMPLANT
TOWEL OR 17X24 6PK STRL BLUE (TOWEL DISPOSABLE) ×2 IMPLANT
TOWEL OR 17X26 10 PK STRL BLUE (TOWEL DISPOSABLE) ×2 IMPLANT
TRAY CATH 16FR W/PLASTIC CATH (SET/KITS/TRAYS/PACK) IMPLANT
TRAY FOLEY CATH SILVER 16FR (SET/KITS/TRAYS/PACK) IMPLANT
WATER STERILE IRR 1000ML POUR (IV SOLUTION) ×6 IMPLANT

## 2017-11-06 NOTE — Interval H&P Note (Signed)
History and Physical Interval Note:  11/06/2017 9:44 AM  Bob Holland  has presented today for surgery, with the diagnosis of Degenerative joint disease left hip  The various methods of treatment have been discussed with the patient and family. After consideration of risks, benefits and other options for treatment, the patient has consented to  Procedure(s): TOTAL HIP ARTHROPLASTY ANTERIOR APPROACH (Left) as a surgical intervention .  The patient's history has been reviewed, patient examined, no change in status, stable for surgery.  I have reviewed the patient's chart and labs.  Questions were answered to the patient's satisfaction.     Hilton Cork Sanam Marmo

## 2017-11-06 NOTE — Discharge Summary (Signed)
Physician Discharge Summary  Patient ID: Bob Holland MRN: [id number] DOB/AGE: [id number] y.o.  Admit date: 11/06/2017 Discharge date: 11/07/2017  Admission Diagnoses:  Osteoarthritis of left hip  Discharge Diagnoses:  Principal Problem:   Osteoarthritis of left hip   Past Medical History:  Diagnosis Date  . Arthritis    osteoarthritis  . Cancer ([place])    hx skin cancer  . Complication of anesthesia 2017   slow to awaken, - 12 hours later, "I Thought I was dying, I couldnt swallow."  . Gastric ulcer     x3  last episode approx 2015  . GERD (gastroesophageal reflux disease)   . Head injury    brief loss of conscious  . History of hiatal hernia   . History of skin cancer   . Nocturia     Surgeries: Procedure(s): TOTAL HIP ARTHROPLASTY ANTERIOR APPROACH on 11/06/2017   Consultants (if any):   Discharged Condition: Improved  Hospital Course: Bob Holland is an 83 y.o. male who was admitted 11/06/2017 with a diagnosis of Osteoarthritis of left hip and went to the operating room on 11/06/2017 and underwent the above named procedures.    He was given perioperative antibiotics:  Anti-infectives (From admission, onward)   Start     Dose/Rate Route Frequency Ordered Stop   11/06/17 1600  clindamycin (CLEOCIN) IVPB 600 mg     600 mg 100 mL/hr over 30 Minutes Intravenous Every 6 hours 11/06/17 1557 11/07/17 0035   11/06/17 1200  clindamycin (CLEOCIN) IVPB 900 mg     900 mg 100 mL/hr over 30 Minutes Intravenous To ShortStay Surgical 11/03/17 0903 11/06/17 1041    .  He was given sequential compression devices, early ambulation, and ASA for DVT prophylaxis.  He benefited maximally from the hospital stay and there were no complications.    Recent vital signs:  Vitals:   11/06/17 2033 11/07/17 0343  BP: 131/73 114/72  Pulse: 73 77  Resp: 16 16  Temp: 98.2 F (36.8 C) 98.2 F (36.8 C)  SpO2: 96% 97%    Recent laboratory studies:  Lab Results  Component Value  Date   HGB 11.2 (L) 11/07/2017   HGB 15.1 11/06/2017   HGB 12.9 (L) 01/22/2016   Lab Results  Component Value Date   WBC 10.0 11/07/2017   PLT 171 11/07/2017   Lab Results  Component Value Date   INR 1.21 01/14/2016   Lab Results  Component Value Date   NA 137 11/07/2017   K 4.8 11/07/2017   CL 103 11/07/2017   CO2 26 11/07/2017   BUN 20 11/07/2017   CREATININE 1.01 11/07/2017   GLUCOSE 122 (H) 11/07/2017    Discharge Medications:   Allergies as of 11/07/2017      Reactions   Aspirin Other (See Comments)   HISTORY OF BLEEDING ULCERS   Cephalexin Swelling, Rash   SWELLING REACTION UNSPECIFIED       Medication List    STOP taking these medications   ibuprofen 200 MG tablet Commonly known as:  ADVIL,MOTRIN     TAKE these medications   aspirin 81 MG tablet Take 1 tablet (81 mg total) by mouth 2 (two) times daily after a meal.   docusate sodium 100 MG capsule Commonly known as:  COLACE Take 1 capsule (100 mg total) by mouth 2 (two) times daily.   HYDROcodone-acetaminophen 5-325 MG tablet Commonly known as:  NORCO Take 1-2 tablets by mouth every 4 (four) hours as needed for moderate  pain.   omeprazole 20 MG capsule Commonly known as:  PRILOSEC Take 1 capsule (20 mg total) by mouth daily.   ondansetron 4 MG tablet Commonly known as:  ZOFRAN Take 1 tablet (4 mg total) by mouth every 8 (eight) hours as needed for nausea or vomiting.   senna 8.6 MG Tabs tablet Commonly known as:  SENOKOT Take 2 tablets (17.2 mg total) by mouth at bedtime.       Diagnostic Studies: Dg Pelvis Portable  Result Date: 11/06/2017 CLINICAL DATA:  Status post left hip arthroplasty. EXAM: PORTABLE PELVIS 1-2 VIEWS COMPARISON:  Earlier today FINDINGS: Hardware components of a left total hip arthroplasty identified. No periprosthetic fracture or dislocation. Mild to moderate degenerative changes in the right hip. IMPRESSION: 1. Status post left total hip arthroplasty.  No  complications. Electronically Signed   By: Kerby Moors M.D.   On: 11/06/2017 13:43   Dg C-arm 1-60 Min  Result Date: 11/06/2017 CLINICAL DATA:  Intraoperative fluoro spot images from left hip arthroplasty EXAM: OPERATIVE left HIP (WITH PELVIS IF PERFORMED) 4 VIEWS TECHNIQUE: Fluoroscopic spot image(s) were submitted for interpretation post-operatively. COMPARISON:  None. FINDINGS: Fluoro time reported is 30 seconds Four fluoro spot films are reviewed. The patient has undergone total left hip arthroplasty. Radiographic positioning of the prosthetic components is good. The interface with the native bone is normal. IMPRESSION: No immediate postprocedure complication following left total hip arthroplasty. Electronically Signed   By: David  Martinique M.D.   On: 11/06/2017 12:31   Dg Hip Operative Unilat W Or W/o Pelvis Left  Result Date: 11/06/2017 CLINICAL DATA:  Intraoperative fluoro spot images from left hip arthroplasty EXAM: OPERATIVE left HIP (WITH PELVIS IF PERFORMED) 4 VIEWS TECHNIQUE: Fluoroscopic spot image(s) were submitted for interpretation post-operatively. COMPARISON:  None. FINDINGS: Fluoro time reported is 30 seconds Four fluoro spot films are reviewed. The patient has undergone total left hip arthroplasty. Radiographic positioning of the prosthetic components is good. The interface with the native bone is normal. IMPRESSION: No immediate postprocedure complication following left total hip arthroplasty. Electronically Signed   By: David  Martinique M.D.   On: 11/06/2017 12:31    Disposition: 01-Home or Self Care    Follow-up Information    Derreck Wiltsey, Aaron Edelman, MD. Schedule an appointment as soon as possible for a visit in 2 week(s).   Specialty:  Orthopedic Surgery Why:  For wound re-check Contact information: [address] [place] [address] [phone]            Signed: Hilton Cork Shanterria Franta 11/10/2017, 9:40 AM

## 2017-11-06 NOTE — Transfer of Care (Signed)
Immediate Anesthesia Transfer of Care Note  Patient: Bob Holland  Procedure(s) Performed: TOTAL HIP ARTHROPLASTY ANTERIOR APPROACH (Left Hip)  Patient Location: PACU  Anesthesia Type:General  Level of Consciousness: awake, oriented and patient cooperative  Airway & Oxygen Therapy: Patient Spontanous Breathing and Patient connected to nasal cannula oxygen  Post-op Assessment: Report given to RN and Post -op Vital signs reviewed and stable  Post vital signs: Reviewed  Last Vitals:  Vitals:   11/06/17 0948  BP: 139/68  Pulse: 66  Resp: 18  Temp: 36.5 C  SpO2: 96%    Last Pain:  Vitals:   11/06/17 0948  TempSrc: Oral         Complications: No apparent anesthesia complications

## 2017-11-06 NOTE — Op Note (Signed)
OPERATIVE REPORT  SURGEON: Rod Can, MD   ASSISTANT: Ky Barban, RNFA.  PREOPERATIVE DIAGNOSIS: Left hip arthritis.   POSTOPERATIVE DIAGNOSIS: Left hip arthritis.   PROCEDURE: Left total hip arthroplasty, anterior approach.   IMPLANTS: DePuy Tri Lock stem, size 11, hi offset. DePuy Pinnacle Cup, size 60 mm. DePuy Altrx liner, size 36 by 60 mm, neutral. DePuy Biolox ceramic head ball, size 36 + 5 mm.  ANESTHESIA:  General  ESTIMATED BLOOD LOSS:-300 mL    ANTIBIOTICS: 900 mg clindamycin.  DRAINS: None.  COMPLICATIONS: None.   CONDITION: PACU - hemodynamically stable.   BRIEF CLINICAL NOTE: Bob Holland is a 83 y.o. male with a long-standing history of Left hip arthritis. After failing conservative management, the patient was indicated for total hip arthroplasty. The risks, benefits, and alternatives to the procedure were explained, and the patient elected to proceed.  PROCEDURE IN DETAIL: Surgical site was marked by myself in the pre-op holding area. Once inside the operating room, spinal anesthesia was obtained, and a foley catheter was inserted. The patient was then positioned on the Hana table. All bony prominences were well padded. The hip was prepped and draped in the normal sterile surgical fashion. A time-out was called verifying side and site of surgery. The patient received IV antibiotics within 60 minutes of beginning the procedure.  The direct anterior approach to the hip was performed through the Hueter interval. Lateral femoral circumflex vessels were treated with the Auqumantys. The anterior capsule was exposed and an inverted T capsulotomy was made.The femoral neck cut was made to the level of the templated cut. A corkscrew was placed into the head and the head was removed. The femoral head was found to have eburnated bone. The head was passed to the back table and was measured.  Acetabular exposure was achieved, and the pulvinar and labrum  were excised. Sequential reaming of the acetabulum was then performed up to a size 59 mm reamer. A 60 mm cup was then opened and impacted into place at approximately 40 degrees of abduction and 20 degrees of anteversion. The final polyethylene liner was impacted into place and acetabular osteophytes were removed.   I then gained femoral exposure taking care to protect the abductors and greater trochanter. This was performed using standard external rotation, extension, and adduction. The capsule was peeled off the inner aspect of the greater trochanter, taking care to preserve the short external rotators. A cookie cutter was used to enter the femoral canal, and then the femoral canal finder was placed. Sequential broaching was performed up to a size 11. Calcar planer was used on the femoral neck remnant. I placed a hi offset neck and a trial head ball. The hip was reduced. Leg lengths and offset were checked fluoroscopically. The hip was dislocated and trial components were removed. The final implants were placed, and the hip was reduced.  Fluoroscopy was used to confirm component position and leg lengths. At 90 degrees of external rotation and full extension, the hip was stable to an anterior directed force.  The wound was copiously irrigated with normal saline using pulse lavage. Marcaine solution was injected into the periarticular soft tissue. The wound was closed in layers using #1 Vicryl and V-Loc for the fascia, 2-0 Vicryl for the subcutaneous fat, 2-0 Monocryl for the deep dermal layer, 3-0 running Monocryl subcuticular stitch, and Dermabond for the skin. Once the glue was fully dried, an Aquacell Ag dressing was applied. The patient was transported to the recovery room  in stable condition. Sponge, needle, and instrument counts were correct at the end of the case x2. The patient tolerated the procedure well and there were no known complications.

## 2017-11-06 NOTE — Discharge Instructions (Signed)
°Dr. Evonte Prestage °Joint Replacement Specialist °[facility] °[address] °[place], [place] [address] °[phone] ° ° °TOTAL HIP REPLACEMENT POSTOPERATIVE DIRECTIONS ° ° ° °Hip Rehabilitation, Guidelines Following Surgery  ° °WEIGHT BEARING °Weight bearing as tolerated with assist device (walker, cane, etc) as directed, use it as long as suggested by your surgeon or therapist, typically at least 4-6 weeks. ° °The results of a hip operation are greatly improved after range of motion and muscle strengthening exercises. Follow all safety measures which are given to protect your hip. If any of these exercises cause increased pain or swelling in your joint, decrease the amount until you are comfortable again. Then slowly increase the exercises. Call your caregiver if you have problems or questions.  ° °HOME CARE INSTRUCTIONS  °Most of the following instructions are designed to prevent the dislocation of your new hip.  °Remove items at home which could result in a fall. This includes throw rugs or furniture in walking pathways.  °Continue medications as instructed at time of discharge. °· You may have some home medications which will be placed on hold until you complete the course of blood thinner medication. °· You may start showering once you are discharged home. Do not remove your dressing. °Do not put on socks or shoes without following the instructions of your caregivers.   °Sit on chairs with arms. Use the chair arms to help push yourself up when arising.  °Arrange for the use of a toilet seat elevator so you are not sitting low.  °· Walk with walker as instructed.  °You may resume a sexual relationship in one month or when given the OK by your caregiver.  °Use walker as long as suggested by your caregivers.  °You may put full weight on your legs and walk as much as is comfortable. °Avoid periods of inactivity such as sitting longer than an hour when not asleep. This helps prevent  blood clots.  °You may return to work once you are cleared by your surgeon.  °Do not drive a car for 6 weeks or until released by your surgeon.  °Do not drive while taking narcotics.  °Wear elastic stockings for two weeks following surgery during the day but you may remove then at night.  °Make sure you keep all of your appointments after your operation with all of your doctors and caregivers. You should call the office at the above phone number and make an appointment for approximately two weeks after the date of your surgery. °Please pick up a stool softener and laxative for home use as long as you are requiring pain medications. °· ICE to the affected hip every three hours for 30 minutes at a time and then as needed for pain and swelling. Continue to use ice on the hip for pain and swelling from surgery. You may notice swelling that will progress down to the foot and ankle.  This is normal after surgery.  Elevate the leg when you are not up walking on it.   °It is important for you to complete the blood thinner medication as prescribed by your doctor. °· Continue to use the breathing machine which will help keep your temperature down.  It is common for your temperature to cycle up and down following surgery, especially at night when you are not up moving around and exerting yourself.  The breathing machine keeps your lungs expanded and your temperature down. ° °RANGE OF MOTION AND STRENGTHENING EXERCISES  °These exercises are   designed to help you keep full movement of your hip joint. Follow your caregiver's or physical therapist's instructions. Perform all exercises about fifteen times, three times per day or as directed. Exercise both hips, even if you have had only one joint replacement. These exercises can be done on a training (exercise) mat, on the floor, on a table or on a bed. Use whatever works the best and is most comfortable for you. Use music or television while you are exercising so that the exercises  are a pleasant break in your day. This will make your life better with the exercises acting as a break in routine you can look forward to.  °Lying on your back, slowly slide your foot toward your buttocks, raising your knee up off the floor. Then slowly slide your foot back down until your leg is straight again.  °Lying on your back spread your legs as far apart as you can without causing discomfort.  °Lying on your side, raise your upper leg and foot straight up from the floor as far as is comfortable. Slowly lower the leg and repeat.  °Lying on your back, tighten up the muscle in the front of your thigh (quadriceps muscles). You can do this by keeping your leg straight and trying to raise your heel off the floor. This helps strengthen the largest muscle supporting your knee.  °Lying on your back, tighten up the muscles of your buttocks both with the legs straight and with the knee bent at a comfortable angle while keeping your heel on the floor.  ° °SKILLED REHAB INSTRUCTIONS: °If the patient is transferred to a skilled rehab facility following release from the hospital, a list of the current medications will be sent to the facility for the patient to continue.  When discharged from the skilled rehab facility, please have the facility set up the patient's Home Health Physical Therapy prior to being released. Also, the skilled facility will be responsible for providing the patient with their medications at time of release from the facility to include their pain medication and their blood thinner medication. If the patient is still at the rehab facility at time of the two week follow up appointment, the skilled rehab facility will also need to assist the patient in arranging follow up appointment in our office and any transportation needs. ° °MAKE SURE YOU:  °Understand these instructions.  °Will watch your condition.  °Will get help right away if you are not doing well or get worse. ° °Pick up stool softner and  laxative for home use following surgery while on pain medications. °Do not remove your dressing. °The dressing is waterproof--it is OK to take showers. °Continue to use ice for pain and swelling after surgery. °Do not use any lotions or creams on the incision until instructed by your surgeon. °Total Hip Protocol. ° ° °

## 2017-11-06 NOTE — Anesthesia Procedure Notes (Signed)
Procedure Name: Intubation Date/Time: 11/06/2017 10:40 AM Performed by: Jenne Campus, CRNA Pre-anesthesia Checklist: Patient identified, Emergency Drugs available, Suction available and Patient being monitored Patient Re-evaluated:Patient Re-evaluated prior to induction Oxygen Delivery Method: Circle System Utilized Preoxygenation: Pre-oxygenation with 100% oxygen Induction Type: IV induction Ventilation: Mask ventilation without difficulty Laryngoscope Size: Miller and 3 Grade View: Grade I Tube type: Oral Tube size: 7.5 mm Number of attempts: 1 Airway Equipment and Method: Stylet and Oral airway Placement Confirmation: ETT inserted through vocal cords under direct vision,  positive ETCO2 and breath sounds checked- equal and bilateral Secured at: 24 cm Tube secured with: Tape Dental Injury: Teeth and Oropharynx as per pre-operative assessment

## 2017-11-06 NOTE — Anesthesia Preprocedure Evaluation (Addendum)
Anesthesia Evaluation  Patient identified by MRN, date of birth, ID band Patient awake    Reviewed: Allergy & Precautions, H&P , NPO status , Patient's Chart, lab work & pertinent test results  Airway Mallampati: II  TM Distance: >3 FB Neck ROM: Limited    Dental  (+) Teeth Intact, Dental Advisory Given   Pulmonary former smoker,    breath sounds clear to auscultation       Cardiovascular negative cardio ROS   Rhythm:regular Rate:Normal     Neuro/Psych    GI/Hepatic hiatal hernia, PUD, GERD  ,  Endo/Other    Renal/GU      Musculoskeletal  (+) Arthritis ,   Abdominal   Peds  Hematology   Anesthesia Other Findings   Reproductive/Obstetrics                            Anesthesia Physical Anesthesia Plan  ASA: II  Anesthesia Plan: General   Post-op Pain Management:    Induction: Intravenous  PONV Risk Score and Plan: 2 and Ondansetron, Dexamethasone and Treatment may vary due to age or medical condition  Airway Management Planned: Oral ETT  Additional Equipment:   Intra-op Plan:   Post-operative Plan: Extubation in OR  Informed Consent: I have reviewed the patients History and Physical, chart, labs and discussed the procedure including the risks, benefits and alternatives for the proposed anesthesia with the patient or authorized representative who has indicated his/her understanding and acceptance.     Plan Discussed with: CRNA, Anesthesiologist and Surgeon  Anesthesia Plan Comments:         Anesthesia Quick Evaluation

## 2017-11-07 ENCOUNTER — Encounter (HOSPITAL_COMMUNITY): Payer: Self-pay | Admitting: Orthopedic Surgery

## 2017-11-07 LAB — CBC
HEMATOCRIT: 34.6 % — AB (ref 39.0–52.0)
HEMOGLOBIN: 11.2 g/dL — AB (ref 13.0–17.0)
MCH: 30 pg (ref 26.0–34.0)
MCHC: 32.4 g/dL (ref 30.0–36.0)
MCV: 92.8 fL (ref 78.0–100.0)
Platelets: 171 10*3/uL (ref 150–400)
RBC: 3.73 MIL/uL — ABNORMAL LOW (ref 4.22–5.81)
RDW: 14.3 % (ref 11.5–15.5)
WBC: 10 10*3/uL (ref 4.0–10.5)

## 2017-11-07 LAB — BASIC METABOLIC PANEL
ANION GAP: 8 (ref 5–15)
BUN: 20 mg/dL (ref 6–20)
CALCIUM: 8.2 mg/dL — AB (ref 8.9–10.3)
CHLORIDE: 103 mmol/L (ref 101–111)
CO2: 26 mmol/L (ref 22–32)
Creatinine, Ser: 1.01 mg/dL (ref 0.61–1.24)
GFR calc non Af Amer: >60 mL/min (ref >60)
GLUCOSE: 122 mg/dL — AB (ref 65–99)
Potassium: 4.8 mmol/L (ref 3.5–5.1)
Sodium: 137 mmol/L (ref 135–145)

## 2017-11-07 NOTE — Care Management Note (Signed)
Case Management Note  Patient Details  Name: Bob Holland MRN: [id number] Date of Birth: December 25, 1929  Subjective/Objective:   84 yr old gentleman s/p left total hip arthroplasty, anterior approach.                Action/Plan: Museum/gallery conservator spoke with patient concerning discharge plan and DME. Patient will follow HEP at discharge. He says he has several rolling walkers at home. Patient requesting a rollator, CM informed him that therapist will need to agree to his ability to use one. Patient will have family support at discharge.    Expected Discharge Date:  11/07/17               Expected Discharge Plan:  Home/Self Care  In-House Referral:     Discharge planning Services  CM Consult  Post Acute Care Choice:  NA(Patient will do HEP) Choice offered to:  Patient  DME Arranged:  (Has RW and 3in1) DME Agency:  NA  HH Arranged:  NA(was setup with [facility] but will do HEP) [place]:  [facility] [employer]  Status of Service:  Completed, signed off  If discussed at [employer], dates discussed:    Additional Comments:  Ninfa Meeker, RN 11/07/2017, 9:49 AM

## 2017-11-07 NOTE — Progress Notes (Signed)
Orthopedic Tech Progress Note Patient Details:  [name] 02-Apr-1930 [id number]  Pt does not meet the requirement for this device.  OHF not applied!  Patient ID: Bob Holland, male   DOB: 18-Feb-1930, 84 y.o.   MRN: [id number]   Kristopher Oppenheim 11/07/2017, 2:37 PM

## 2017-11-07 NOTE — Evaluation (Signed)
Physical Therapy Evaluation Patient Details Name: Bob Holland MRN: [id number] DOB: [birth year] Today's Date: 11/07/2017   History of Present Illness  Pt is an 84 y/o male who presents s/p L THA on 10/27/17. Pt is currently WBAT. PMH significant for skin CA, head injury.   Clinical Impression  Pt admitted with above diagnosis. Pt currently with functional limitations due to the deficits listed below (see PT Problem List). At the time of PT eval pt was able to perform transfers and ambulation with gross min guard to min assist for balance support and safety. Pt will benefit from skilled PT to increase their independence and safety with mobility to allow discharge to the venue listed below.       Follow Up Recommendations Home health PT;Supervision/Assistance - 24 hour    Equipment Recommendations  None recommended by PT    Recommendations for Other Services       Precautions / Restrictions Precautions Precautions: Fall Restrictions Weight Bearing Restrictions: Yes LLE Weight Bearing: Weight bearing as tolerated      Mobility  Bed Mobility Overal bed mobility: Needs Assistance Bed Mobility: Supine to Sit     Supine to sit: Min assist     General bed mobility comments: Assist to advance leg and elevate trunk to full sitting position.   Transfers Overall transfer level: Needs assistance Equipment used: Rolling walker (2 wheeled) Transfers: Sit to/from Stand Sit to Stand: Min assist;From elevated surface         General transfer comment: Increased time for power-up to full stand. Assist to elevate hips off bed.   Ambulation/Gait Ambulation/Gait assistance: Min assist Ambulation Distance (Feet): 50 Feet Assistive device: Rolling walker (2 wheeled) Gait Pattern/deviations: Step-through pattern;Decreased stride length;Trunk flexed Gait velocity: Decreased Gait velocity interpretation: Below normal speed for age/gender General Gait Details: Occasional assist to advance  RW. Pt required cues for improved posture and sequencing with the RW.   Stairs            Wheelchair Mobility    Modified Rankin (Stroke Patients Only)       Balance Overall balance assessment: Needs assistance Sitting-balance support: Feet supported;No upper extremity supported Sitting balance-Leahy Scale: Fair     Standing balance support: Bilateral upper extremity supported;During functional activity Standing balance-Leahy Scale: Poor Standing balance comment: Pt reliant on RW for support                             Pertinent Vitals/Pain Pain Assessment: Faces Faces Pain Scale: Hurts even more Pain Location: L hip Pain Descriptors / Indicators: Operative site guarding;Aching Pain Intervention(s): Limited activity within patient's tolerance;Monitored during session;Repositioned    Home Living Family/patient expects to be discharged to:: Private residence Living Arrangements: Spouse/significant other Available Help at Discharge: Family;Available 24 hours/day Type of Home: House Home Access: Level entry Entrance Stairs-Rails: None   Home Layout: Two level;Able to live on main level with bedroom/bathroom Home Equipment: Gilford Rile - 2 wheels;Cane - single point;Bedside commode Additional Comments: Pt reports his wife is "worse off" than he is, but his son is available to assist when needed as well.     Prior Function Level of Independence: Independent with assistive device(s)         Comments: Pt states he has been ambulating with bilateral canes.      Hand Dominance   Dominant Hand: Right    Extremity/Trunk Assessment   Upper Extremity Assessment Upper Extremity Assessment: Defer to OT  evaluation    Lower Extremity Assessment Lower Extremity Assessment: LLE deficits/detail LLE Deficits / Details: Acute pain, decreased strength and AROM consistent with above mentioned procedure.  LLE: Unable to fully assess due to pain    Cervical / Trunk  Assessment Cervical / Trunk Assessment: Other exceptions Cervical / Trunk Exceptions: Forward head posture and rounded shoulders  Communication   Communication: No difficulties  Cognition Arousal/Alertness: Awake/alert Behavior During Therapy: WFL for tasks assessed/performed Overall Cognitive Status: Within Functional Limits for tasks assessed                                        General Comments      Exercises General Exercises - Lower Extremity Ankle Circles/Pumps: 10 reps Quad Sets: 10 reps Long Arc Quad: 10 reps   Assessment/Plan    PT Assessment Patient needs continued PT services  PT Problem List Decreased strength;Decreased range of motion;Decreased activity tolerance;Decreased balance;Decreased mobility;Decreased knowledge of use of DME;Decreased safety awareness;Decreased knowledge of precautions;Pain       PT Treatment Interventions Gait training;Stair training;DME instruction;Functional mobility training;Therapeutic activities;Therapeutic exercise;Neuromuscular re-education;Patient/family education    PT Goals (Current goals can be found in the Care Plan section)  Acute Rehab PT Goals Patient Stated Goal: Home this afternoon PT Goal Formulation: With patient Time For Goal Achievement: 11/14/17 Potential to Achieve Goals: Good    Frequency 7X/week   Barriers to discharge        Co-evaluation               AM-PAC PT "6 Clicks" Daily Activity  Outcome Measure Difficulty turning over in bed (including adjusting bedclothes, sheets and blankets)?: Unable Difficulty moving from lying on back to sitting on the side of the bed? : Unable Difficulty sitting down on and standing up from a chair with arms (e.g., wheelchair, bedside commode, etc,.)?: Unable Help needed moving to and from a bed to chair (including a wheelchair)?: A Little Help needed walking in hospital room?: A Little Help needed climbing 3-5 steps with a railing? : A Lot 6  Click Score: 11    End of Session Equipment Utilized During Treatment: Gait belt Activity Tolerance: Patient tolerated treatment well Patient left: in chair;with call bell/phone within reach Nurse Communication: Mobility status PT Visit Diagnosis: Unsteadiness on feet (R26.81);Pain;Difficulty in walking, not elsewhere classified (R26.2) Pain - Right/Left: Left Pain - part of body: Hip    Time: 0810-0842 PT Time Calculation (min) (ACUTE ONLY): 32 min   Charges:   PT Evaluation $PT Eval Moderate Complexity: 1 Mod PT Treatments $Gait Training: 8-22 mins   PT G Codes:        Rolinda Roan, PT, DPT Acute Rehabilitation Services Pager: [phone]   Thelma Comp 11/07/2017, 9:42 AM

## 2017-11-07 NOTE — Progress Notes (Signed)
Subjective: 1 Day Post-Op Procedure(s) (LRB): TOTAL HIP ARTHROPLASTY ANTERIOR APPROACH (Left) Patient reports pain as 1 on 0-10 scale. Doing very well .Will DC.   Objective: Vital signs in last 24 hours: Temp:  [97.5 F (36.4 C)-98.3 F (36.8 C)] 98.2 F (36.8 C) (01/01 0343) Pulse Rate:  [66-88] 77 (01/01 0343) Resp:  [13-25] 16 (01/01 0343) BP: (114-149)/(68-86) 114/72 (01/01 0343) SpO2:  [91 %-99 %] 97 % (01/01 0343) Weight:  [112.5 kg (248 lb)] 112.5 kg (248 lb) (12/31 0948)  Intake/Output from previous day: 12/31 0701 - 01/01 0700 In: 1500 [I.V.:1500] Out: 1000 [Urine:700; Blood:300] Intake/Output this shift: No intake/output data recorded.  Recent Labs    11/06/17 1004 11/07/17 0420  HGB 15.1 11.2*   Recent Labs    11/06/17 1004 11/07/17 0420  WBC 6.7 10.0  RBC 4.88 3.73*  HCT 45.5 34.6*  PLT 210 171   Recent Labs    11/06/17 1127 11/07/17 0420  NA 136 137  K 4.1 4.8  CL 105 103  CO2 23 26  BUN 15 20  CREATININE 0.93 1.01  GLUCOSE 112* 122*  CALCIUM 8.9 8.2*   No results for input(s): LABPT, INR in the last 72 hours.  Dorsiflexion/Plantar flexion intact  Assessment/Plan: 1 Day Post-Op Procedure(s) (LRB): TOTAL HIP ARTHROPLASTY ANTERIOR APPROACH (Left) Up with therapy,then DC  Latanya Maudlin 11/07/2017, 8:04 AM

## 2017-11-07 NOTE — Progress Notes (Signed)
Reviewed discharge papers and medications with son and patient with full understanding.  IV d/c

## 2017-11-07 NOTE — Progress Notes (Signed)
Physical Therapy Treatment Patient Details Name: Bob Holland MRN: [id number] DOB: [birth year] Today's Date: 11/07/2017    History of Present Illness Pt is an 84 y/o male who presents s/p L THA on 10/27/17. Pt is currently WBAT. PMH significant for skin CA, head injury.     PT Comments    Pt progressing towards physical therapy goals. Was able to perform transfers and ambulation with up to mod assist for power-up to stand, balance support and safety. Pt reports he does not wish for home health therapy to follow up, however feel he would benefit from continued skilled PT to improve gait technique, transfer training, and general safety with mobility. Will continue to follow and progress as able per POC.   Follow Up Recommendations  Home health PT;Supervision/Assistance - 24 hour     Equipment Recommendations  None recommended by PT    Recommendations for Other Services       Precautions / Restrictions Precautions Precautions: Fall Restrictions Weight Bearing Restrictions: Yes LLE Weight Bearing: Weight bearing as tolerated    Mobility  Bed Mobility               General bed mobility comments: Pt sitting up in recliner upon PT arrival.   Transfers Overall transfer level: Needs assistance Equipment used: Rolling walker (2 wheeled) Transfers: Sit to/from Stand Sit to Stand: Mod assist         General transfer comment: Mod assist for power-up to full stand from recliner chair with pillow in the seat to boost him up. Pt pushed off the arm rest of the chair with RUE and pulled up to stand from son with the LUE.   Ambulation/Gait Ambulation/Gait assistance: Min guard Ambulation Distance (Feet): 200 Feet Assistive device: Rolling walker (2 wheeled) Gait Pattern/deviations: Step-through pattern;Decreased stride length;Trunk flexed Gait velocity: Decreased Gait velocity interpretation: Below normal speed for age/gender General Gait Details: Pt required cues for improved  posture and sequencing with the RW.    Stairs Stairs: Yes   Stair Management: One rail Left;Step to pattern;Forwards Number of Stairs: 4(2x4) General stair comments: Pt was cued for sequencing and general safety with stair negotiation. Initially practiced with 2 rails, and then progressed to 1 rail and son's hand to simulate home environment.   Wheelchair Mobility    Modified Rankin (Stroke Patients Only)       Balance Overall balance assessment: Needs assistance Sitting-balance support: Feet supported;No upper extremity supported Sitting balance-Leahy Scale: Fair     Standing balance support: Bilateral upper extremity supported;During functional activity Standing balance-Leahy Scale: Poor Standing balance comment: Pt reliant on RW for support                            Cognition Arousal/Alertness: Awake/alert Behavior During Therapy: WFL for tasks assessed/performed Overall Cognitive Status: Within Functional Limits for tasks assessed                                        Exercises General Exercises - Lower Extremity Quad Sets: 10 reps Short Arc Quad: 10 reps Long Arc Quad: 10 reps Hip ABduction/ADduction: 10 reps    General Comments        Pertinent Vitals/Pain Pain Assessment: Faces Faces Pain Scale: Hurts even more Pain Location: L hip Pain Descriptors / Indicators: Operative site guarding;Aching Pain Intervention(s): Limited activity within patient's tolerance;Monitored during  session;Repositioned    Home Living                      Prior Function            PT Goals (current goals can now be found in the care plan section) Acute Rehab PT Goals Patient Stated Goal: Home this afternoon PT Goal Formulation: With patient Time For Goal Achievement: 11/14/17 Potential to Achieve Goals: Good Progress towards PT goals: Progressing toward goals    Frequency    7X/week      PT Plan Current plan remains  appropriate    Co-evaluation              AM-PAC PT "6 Clicks" Daily Activity  Outcome Measure  Difficulty turning over in bed (including adjusting bedclothes, sheets and blankets)?: Unable Difficulty moving from lying on back to sitting on the side of the bed? : Unable Difficulty sitting down on and standing up from a chair with arms (e.g., wheelchair, bedside commode, etc,.)?: Unable Help needed moving to and from a bed to chair (including a wheelchair)?: A Little Help needed walking in hospital room?: A Little Help needed climbing 3-5 steps with a railing? : A Lot 6 Click Score: 11    End of Session Equipment Utilized During Treatment: Gait belt Activity Tolerance: Patient tolerated treatment well Patient left: in chair;with call bell/phone within reach Nurse Communication: Mobility status PT Visit Diagnosis: Unsteadiness on feet (R26.81);Pain;Difficulty in walking, not elsewhere classified (R26.2) Pain - Right/Left: Left Pain - part of body: Hip     Time: 9675-9163 PT Time Calculation (min) (ACUTE ONLY): 30 min  Charges:  $Gait Training: 23-37 mins                    G Codes:       Rolinda Roan, PT, DPT Acute Rehabilitation Services Pager: [phone]    Thelma Comp 11/07/2017, 2:10 PM

## 2017-11-08 NOTE — Anesthesia Postprocedure Evaluation (Signed)
Anesthesia Post Note  Patient: Bob Holland  Procedure(s) Performed: TOTAL HIP ARTHROPLASTY ANTERIOR APPROACH (Left Hip)     Patient location during evaluation: PACU Anesthesia Type: General Level of consciousness: awake and alert Pain management: pain level controlled Vital Signs Assessment: post-procedure vital signs reviewed and stable Respiratory status: spontaneous breathing, nonlabored ventilation, respiratory function stable and patient connected to nasal cannula oxygen Cardiovascular status: blood pressure returned to baseline and stable Postop Assessment: no apparent nausea or vomiting Anesthetic complications: no    Last Vitals:  Vitals:   11/06/17 2033 11/07/17 0343  BP: 131/73 114/72  Pulse: 73 77  Resp: 16 16  Temp: 36.8 C 36.8 C  SpO2: 96% 97%    Last Pain:  Vitals:   11/07/17 1400  TempSrc:   PainSc: 2                  Tyan Lasure S

## 2019-04-12 IMAGING — CR DG PORTABLE PELVIS
2 series · 2 of 2 positions shown · non-contrast
Comparison: Earlier today

CLINICAL DATA: Status post left hip arthroplasty.

EXAM:
PORTABLE PELVIS 1-2 VIEWS

[AP (1 of 2)]
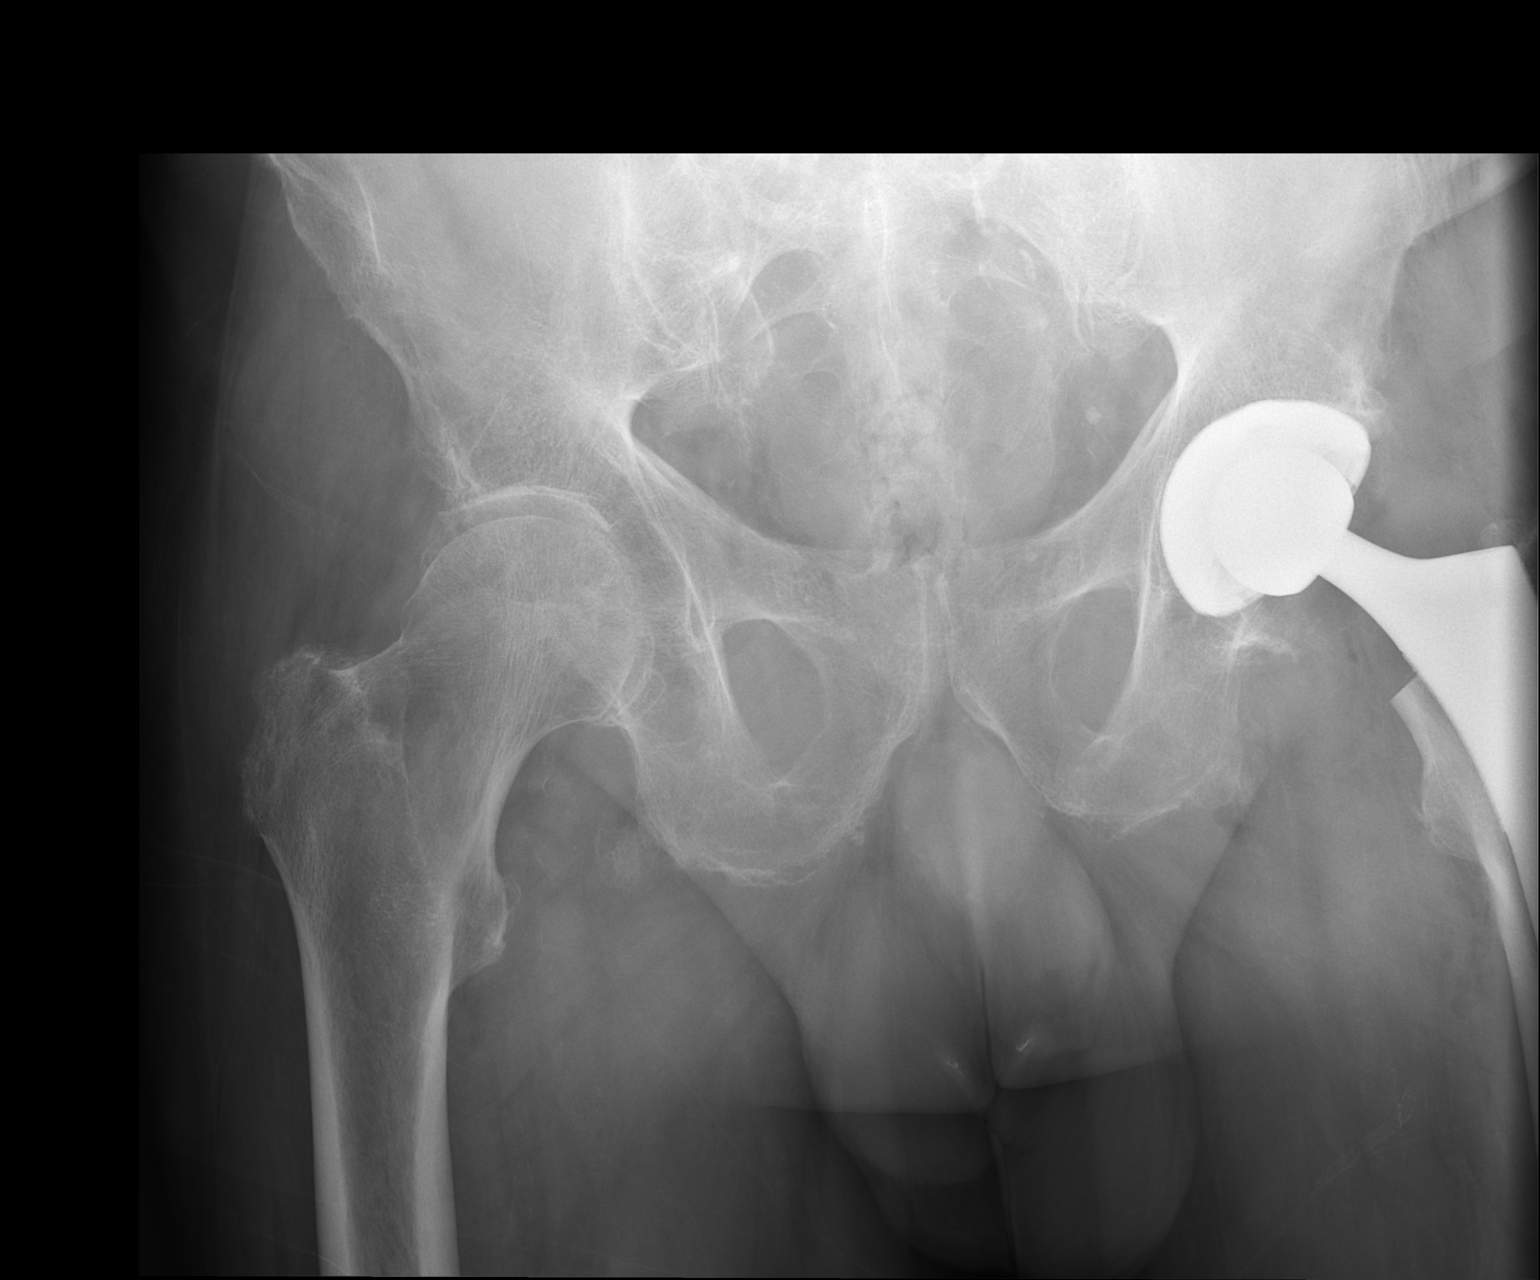

[AP (2 of 2)]
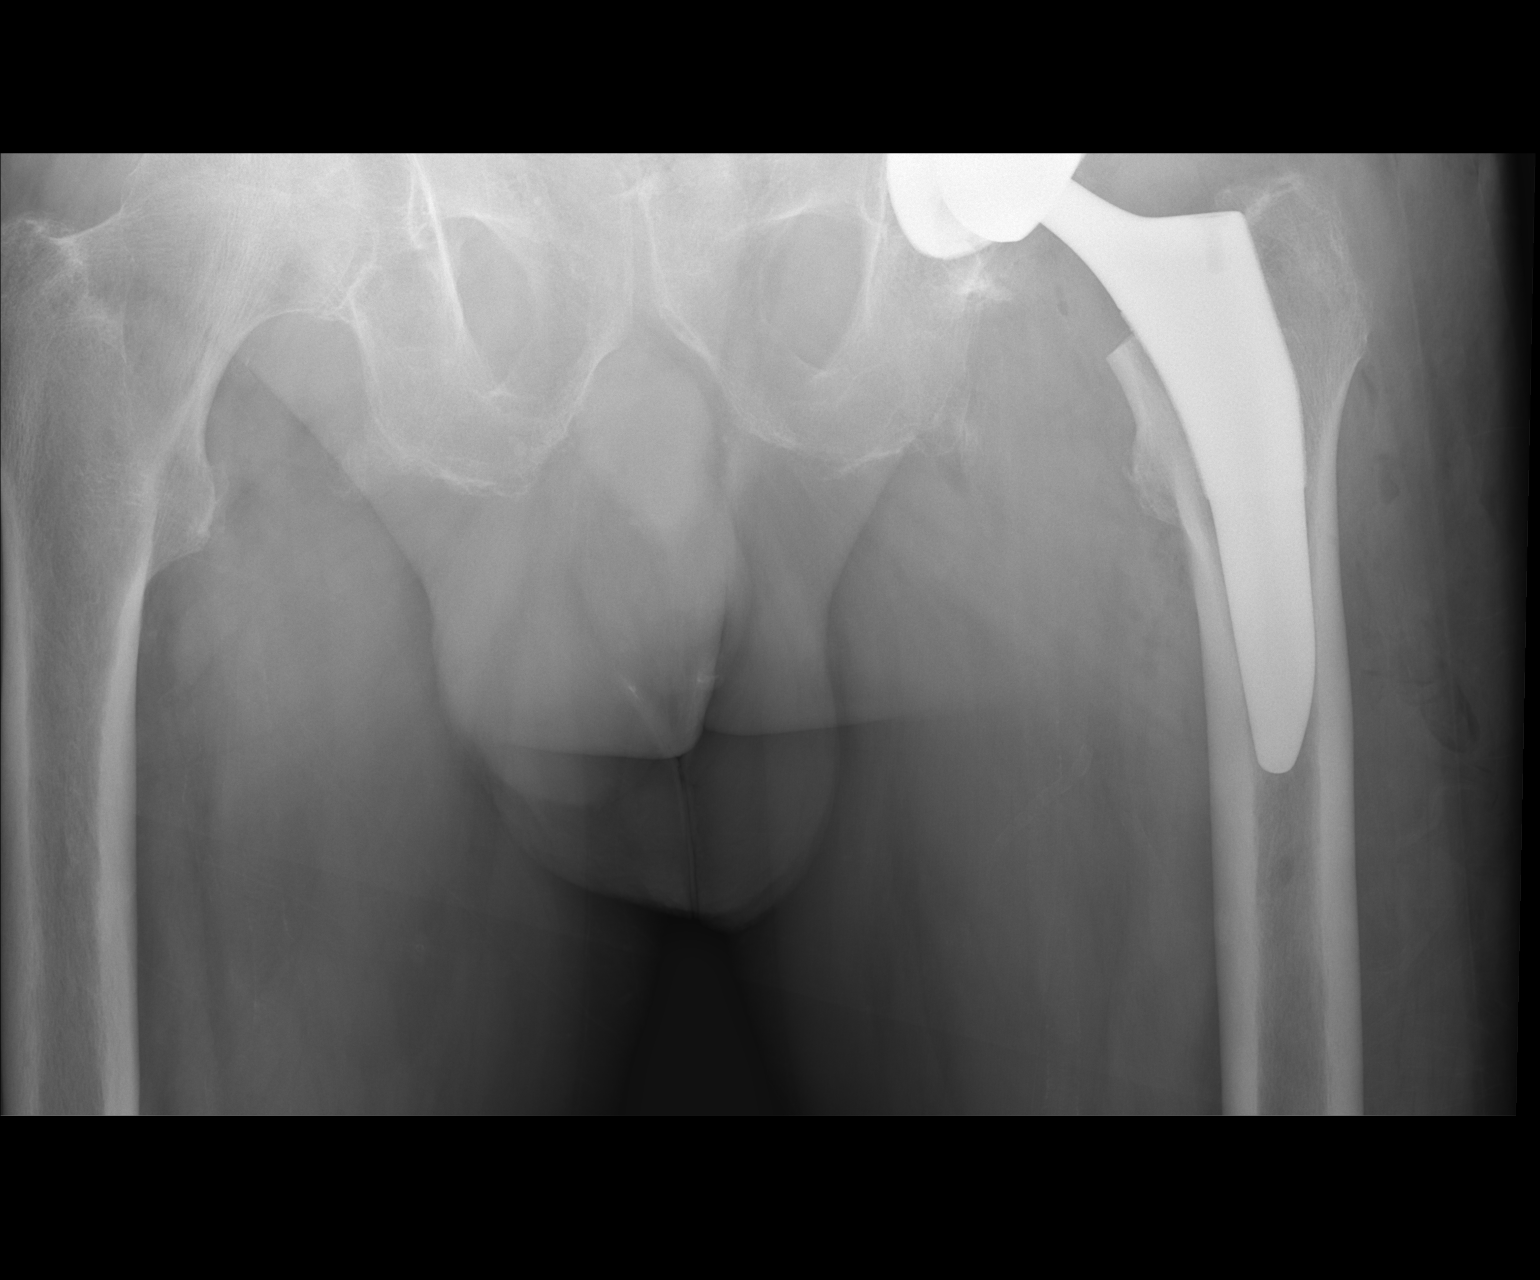

[2 of 2 positions shown; findings below may reference images not displayed]

FINDINGS: Hardware components of a left total hip arthroplasty identified. No
periprosthetic fracture or dislocation. Mild to moderate
degenerative changes in the right hip.
IMPRESSION: 1. Status post left total hip arthroplasty.  No complications.

## 2020-07-01 ENCOUNTER — Telehealth: Payer: Self-pay | Admitting: *Deleted

## 2020-07-01 NOTE — Telephone Encounter (Signed)
Called patient to schedule appointment with Dr. Sondra Come and patient wishes to wait and speak to Dr. Nevada Crane before making up his mind if he wants to move further with treatment.

## 2020-07-23 ENCOUNTER — Telehealth: Payer: Self-pay | Admitting: Radiation Oncology

## 2020-07-23 NOTE — Telephone Encounter (Signed)
I followed up with patient this morning due to him wishing to speak to referring provider, Dr. Nevada Crane, about having radiation tx. Pt has decided he does not want radiation so no appt has been made.

## 2022-10-20 ENCOUNTER — Ambulatory Visit: Payer: Medicare Other | Admitting: Surgery

## 2022-10-20 VITALS — BP 138/67 | HR 62 | Temp 97.4°F | Resp 14 | Ht 72.0 in | Wt 211.0 lb

## 2022-10-20 DIAGNOSIS — K4090 Unilateral inguinal hernia, without obstruction or gangrene, not specified as recurrent: Secondary | ICD-10-CM

## 2022-10-20 NOTE — Progress Notes (Signed)
[facility] History and Physical  Reason for Referral: Right inguinal hernia Referring Physician: Dr. Manuella Ghazi  Chief Complaint   New Patient (Initial Visit)     Bob Holland is a 88 y.o. male.  HPI: Patient presents for evaluation of a right inguinal hernia.  He has noted a bulge in his right groin that has been present for a couple of months.  He is able to push it back in when it comes out.  He notes the bulge when he has coughing fits or does any kind of heavy lifting.  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  He does express pain with the hernia when it is bulging out.  He has some baseline constipation, but this is chronic for him.  He denies any nausea or vomiting.  He is passing flatus, and is last bowel movement was a couple days ago.  He has no significant past medical history and denies use of any medications, including blood thinning medications.  His surgical history significant for knee replacement, left hip replacement, and wrist surgery.  He denies use of tobacco products, alcohol, and illicit drugs.  Past Medical History:  Diagnosis Date   Arthritis    osteoarthritis   Cancer ([place])    hx skin cancer   Complication of anesthesia 2017   slow to awaken, - 12 hours later, "I Thought I was dying, I couldnt swallow."   Gastric ulcer     x3  last episode approx 2015   GERD (gastroesophageal reflux disease)    Head injury    brief loss of conscious   History of hiatal hernia    History of skin cancer    Nocturia     Past Surgical History:  Procedure Laterality Date   JOINT REPLACEMENT     KNEE ARTHROPLASTY Right 01/21/2016   Procedure: RIGHT TOTAL KNEE ARTHROPLASTY WITH NAVIGATION;  Surgeon: Rod Can, MD;  Location: [facility];  Service: Orthopedics;  Laterality: Right;   SHOULDER SURGERY     rt   TOTAL HIP ARTHROPLASTY Left 11/06/2017   Procedure: TOTAL HIP ARTHROPLASTY ANTERIOR APPROACH;  Surgeon: Rod Can, MD;  Location: [place];  Service: Orthopedics;  Laterality: Left;   TOTAL KNEE ARTHROPLASTY  1998   left   WRIST SURGERY     left    Family History  Problem Relation Age of Onset   Stomach cancer Father    CVA Sister     Social History   Tobacco Use   Smoking status: Former    Types: Cigarettes    Quit date: 01/13/1989    Years since quitting: 33.7   Smokeless tobacco: Never  Vaping Use   Vaping Use: Never used  Substance Use Topics   Alcohol use: No   Drug use: No    Medications: I have reviewed the patient's current medications. Allergies as of 10/20/2022       Reactions   Aspirin Other (See Comments)   HISTORY OF BLEEDING ULCERS   Cephalexin Swelling, Rash   SWELLING REACTION UNSPECIFIED         Medication List        Accurate as of October 20, 2022  2:44 PM. If you have any questions, ask your nurse or doctor.          STOP taking these medications    aspirin 81 MG tablet   docusate sodium 100 MG capsule Commonly known as:  Colace   HYDROcodone-acetaminophen 5-325 MG tablet Commonly known as: Norco   omeprazole 20 MG capsule Commonly known as: PriLOSEC   ondansetron 4 MG tablet Commonly known as: Zofran   senna 8.6 MG Tabs tablet Commonly known as: SENOKOT         ROS:  Constitutional: negative for chills, fatigue, and fevers Eyes: negative for visual disturbance and pain Ears, nose, mouth, throat, and face: negative for ear drainage, sore throat, and sinus problems Respiratory: negative for cough, wheezing, and shortness of breath Cardiovascular: negative for chest pain and palpitations Gastrointestinal: positive for abdominal pain, negative for nausea, reflux symptoms, and vomiting Genitourinary:positive for frequency, negative for dysuria and urinary retention Integument/breast: positive for dryness, negative for rash Hematologic/lymphatic: negative for bleeding and lymphadenopathy Musculoskeletal:positive for joint pain,  negative for back pain and neck pain Neurological: positive for tremors, negative for dizziness and numbness Endocrine: negative for temperature intolerance  Blood pressure 138/67, pulse 62, temperature (!) 97.4 F (36.3 C), temperature source Oral, resp. rate 14, height 6' (1.829 m), weight 211 lb (95.7 kg), SpO2 92 %. Physical Exam Vitals reviewed.  Constitutional:      Appearance: Normal appearance.  HENT:     Head: Normocephalic and atraumatic.  Eyes:     Extraocular Movements: Extraocular movements intact.     Pupils: Pupils are equal, round, and reactive to light.  Cardiovascular:     Rate and Rhythm: Normal rate and regular rhythm.  Pulmonary:     Effort: Pulmonary effort is normal.     Breath sounds: Normal breath sounds.  Abdominal:     Comments: Abdomen soft, nondistended, no percussion tenderness, nontender to palpation; no rigidity, guarding, or tenderness; soft and reducible right inguinal hernia, no palpable hernia defects on the left  Musculoskeletal:        General: Normal range of motion.     Cervical back: Normal range of motion.  Skin:    General: Skin is warm and dry.  Neurological:     General: No focal deficit present.     Mental Status: He is alert and oriented to person, place, and time.  Psychiatric:        Mood and Affect: Mood normal.        Behavior: Behavior normal.     Results: No results found for this or any previous visit (from the past 48 hour(s)).  No results found.   Assessment & Plan:  Bob Holland is a 88 y.o. male who presents for evaluation of right inguinal hernia.  -Explained the pathophysiology of hernias, and why we recommend surgical repair. -The risk and benefits of open right inguinal hernia repair and robotic right inguinal hernia repair were discussed including but not limited to bleeding, infection, injury to surrounding structures, hernia recurrence, and need for additional procedures.  After careful consideration, Bob Holland has decided to proceed with open right inguinal hernia repair with mesh. -Patient tentatively scheduled for surgery on 12/28 -Information provided to the patient regarding inguinal hernias. -Advised him to present to the emergency department if he begins to have worsening pain at his hernia bulge, bulges nonreducible, nausea, vomiting, and obstipation  All questions were answered to the satisfaction of the patient.   Graciella Freer, DO [facility] [address] [place], [place] [address] [phone] (office)

## 2022-10-24 NOTE — H&P (Signed)
[facility] History and Physical  Reason for Referral: Right inguinal hernia Referring Physician: Dr. Manuella Ghazi  Chief Complaint   New Patient (Initial Visit)     Bob Holland is a 88 y.o. male.  HPI: Patient presents for evaluation of a right inguinal hernia.  He has noted a bulge in his right groin that has been present for a couple of months.  He is able to push it back in when it comes out.  He notes the bulge when he has coughing fits or does any kind of heavy lifting.  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  He does express pain with the hernia when it is bulging out.  He has some baseline constipation, but this is chronic for him.  He denies any nausea or vomiting.  He is passing flatus, and is last bowel movement was a couple days ago.  He has no significant past medical history and denies use of any medications, including blood thinning medications.  His surgical history significant for knee replacement, left hip replacement, and wrist surgery.  He denies use of tobacco products, alcohol, and illicit drugs.  Past Medical History:  Diagnosis Date   Arthritis    osteoarthritis   Cancer ([place])    hx skin cancer   Complication of anesthesia 2017   slow to awaken, - 12 hours later, "I Thought I was dying, I couldnt swallow."   Gastric ulcer     x3  last episode approx 2015   GERD (gastroesophageal reflux disease)    Head injury    brief loss of conscious   History of hiatal hernia    History of skin cancer    Nocturia     Past Surgical History:  Procedure Laterality Date   JOINT REPLACEMENT     KNEE ARTHROPLASTY Right 01/21/2016   Procedure: RIGHT TOTAL KNEE ARTHROPLASTY WITH NAVIGATION;  Surgeon: Rod Can, MD;  Location: [facility];  Service: Orthopedics;  Laterality: Right;   SHOULDER SURGERY     rt   TOTAL HIP ARTHROPLASTY Left 11/06/2017   Procedure: TOTAL HIP ARTHROPLASTY ANTERIOR APPROACH;  Surgeon: Rod Can, MD;  Location: [place];  Service: Orthopedics;  Laterality: Left;   TOTAL KNEE ARTHROPLASTY  1998   left   WRIST SURGERY     left    Family History  Problem Relation Age of Onset   Stomach cancer Father    CVA Sister     Social History   Tobacco Use   Smoking status: Former    Types: Cigarettes    Quit date: 01/13/1989    Years since quitting: 33.7   Smokeless tobacco: Never  Vaping Use   Vaping Use: Never used  Substance Use Topics   Alcohol use: No   Drug use: No    Medications: I have reviewed the patient's current medications. Allergies as of 10/20/2022       Reactions   Aspirin Other (See Comments)   HISTORY OF BLEEDING ULCERS   Cephalexin Swelling, Rash   SWELLING REACTION UNSPECIFIED         Medication List        Accurate as of October 20, 2022  2:44 PM. If you have any questions, ask your nurse or doctor.          STOP taking these medications    aspirin 81 MG tablet   docusate sodium 100 MG capsule Commonly known as:  Colace   HYDROcodone-acetaminophen 5-325 MG tablet Commonly known as: Norco   omeprazole 20 MG capsule Commonly known as: PriLOSEC   ondansetron 4 MG tablet Commonly known as: Zofran   senna 8.6 MG Tabs tablet Commonly known as: SENOKOT         ROS:  Constitutional: negative for chills, fatigue, and fevers Eyes: negative for visual disturbance and pain Ears, nose, mouth, throat, and face: negative for ear drainage, sore throat, and sinus problems Respiratory: negative for cough, wheezing, and shortness of breath Cardiovascular: negative for chest pain and palpitations Gastrointestinal: positive for abdominal pain, negative for nausea, reflux symptoms, and vomiting Genitourinary:positive for frequency, negative for dysuria and urinary retention Integument/breast: positive for dryness, negative for rash Hematologic/lymphatic: negative for bleeding and lymphadenopathy Musculoskeletal:positive for joint pain,  negative for back pain and neck pain Neurological: positive for tremors, negative for dizziness and numbness Endocrine: negative for temperature intolerance  Blood pressure 138/67, pulse 62, temperature (!) 97.4 F (36.3 C), temperature source Oral, resp. rate 14, height 6' (1.829 m), weight 211 lb (95.7 kg), SpO2 92 %. Physical Exam Vitals reviewed.  Constitutional:      Appearance: Normal appearance.  HENT:     Head: Normocephalic and atraumatic.  Eyes:     Extraocular Movements: Extraocular movements intact.     Pupils: Pupils are equal, round, and reactive to light.  Cardiovascular:     Rate and Rhythm: Normal rate and regular rhythm.  Pulmonary:     Effort: Pulmonary effort is normal.     Breath sounds: Normal breath sounds.  Abdominal:     Comments: Abdomen soft, nondistended, no percussion tenderness, nontender to palpation; no rigidity, guarding, or tenderness; soft and reducible right inguinal hernia, no palpable hernia defects on the left  Musculoskeletal:        General: Normal range of motion.     Cervical back: Normal range of motion.  Skin:    General: Skin is warm and dry.  Neurological:     General: No focal deficit present.     Mental Status: He is alert and oriented to person, place, and time.  Psychiatric:        Mood and Affect: Mood normal.        Behavior: Behavior normal.     Results: No results found for this or any previous visit (from the past 48 hour(s)).  No results found.   Assessment & Plan:  Bob Holland is a 88 y.o. male who presents for evaluation of right inguinal hernia.  -Explained the pathophysiology of hernias, and why we recommend surgical repair. -The risk and benefits of open right inguinal hernia repair and robotic right inguinal hernia repair were discussed including but not limited to bleeding, infection, injury to surrounding structures, hernia recurrence, and need for additional procedures.  After careful consideration, Bob Holland has decided to proceed with open right inguinal hernia repair with mesh. -Patient tentatively scheduled for surgery on 12/28 -Information provided to the patient regarding inguinal hernias. -Advised him to present to the emergency department if he begins to have worsening pain at his hernia bulge, bulges nonreducible, nausea, vomiting, and obstipation  All questions were answered to the satisfaction of the patient.   Graciella Freer, DO [facility] [address] [place], [place] [address] [phone] (office)

## 2022-10-27 ENCOUNTER — Encounter (HOSPITAL_COMMUNITY): Payer: Self-pay

## 2022-10-28 ENCOUNTER — Encounter (HOSPITAL_COMMUNITY)
Admission: RE | Admit: 2022-10-28 | Discharge: 2022-10-28 | Disposition: A | Discharge status: Home / Self Care | Payer: Medicare Other | Source: Ambulatory Visit | Attending: Surgery | Admitting: Surgery

## 2022-11-03 ENCOUNTER — Ambulatory Visit (HOSPITAL_COMMUNITY): Payer: Medicare Other | Admitting: Anesthesiology

## 2022-11-03 ENCOUNTER — Encounter (HOSPITAL_COMMUNITY): Payer: Self-pay | Admitting: Surgery

## 2022-11-03 ENCOUNTER — Other Ambulatory Visit: Payer: Self-pay

## 2022-11-03 ENCOUNTER — Encounter (HOSPITAL_COMMUNITY)
Admission: RE | Disposition: A | Discharge status: Home / Self Care | Payer: Self-pay | Source: Home / Self Care | Attending: Surgery

## 2022-11-03 ENCOUNTER — Ambulatory Visit (HOSPITAL_COMMUNITY)
Admission: RE | Admit: 2022-11-03 | Discharge: 2022-11-03 | Disposition: A | Discharge status: Home / Self Care | Payer: Medicare Other | Attending: Surgery | Admitting: Surgery

## 2022-11-03 ENCOUNTER — Ambulatory Visit (HOSPITAL_BASED_OUTPATIENT_CLINIC_OR_DEPARTMENT_OTHER): Payer: Medicare Other | Admitting: Anesthesiology

## 2022-11-03 DIAGNOSIS — K219 Gastro-esophageal reflux disease without esophagitis: Secondary | ICD-10-CM | POA: Insufficient documentation

## 2022-11-03 DIAGNOSIS — K449 Diaphragmatic hernia without obstruction or gangrene: Secondary | ICD-10-CM | POA: Insufficient documentation

## 2022-11-03 DIAGNOSIS — M1990 Unspecified osteoarthritis, unspecified site: Secondary | ICD-10-CM | POA: Insufficient documentation

## 2022-11-03 DIAGNOSIS — Z96653 Presence of artificial knee joint, bilateral: Secondary | ICD-10-CM | POA: Insufficient documentation

## 2022-11-03 DIAGNOSIS — K279 Peptic ulcer, site unspecified, unspecified as acute or chronic, without hemorrhage or perforation: Secondary | ICD-10-CM

## 2022-11-03 DIAGNOSIS — Z87891 Personal history of nicotine dependence: Secondary | ICD-10-CM | POA: Insufficient documentation

## 2022-11-03 DIAGNOSIS — K4090 Unilateral inguinal hernia, without obstruction or gangrene, not specified as recurrent: Secondary | ICD-10-CM | POA: Insufficient documentation

## 2022-11-03 DIAGNOSIS — Z96642 Presence of left artificial hip joint: Secondary | ICD-10-CM | POA: Diagnosis not present

## 2022-11-03 DIAGNOSIS — K5900 Constipation, unspecified: Secondary | ICD-10-CM | POA: Insufficient documentation

## 2022-11-03 HISTORY — PX: INGUINAL HERNIA REPAIR: SHX194

## 2022-11-03 LAB — POCT I-STAT, CHEM 8
BUN: 24 mg/dL — ABNORMAL HIGH (ref 8–23)
Calcium, Ion: 1.16 mmol/L (ref 1.15–1.40)
Chloride: 108 mmol/L (ref 98–111)
Creatinine, Ser: 1.1 mg/dL (ref 0.61–1.24)
Glucose, Bld: 101 mg/dL — ABNORMAL HIGH (ref 70–99)
HCT: 43 % (ref 39.0–52.0)
Hemoglobin: 14.6 g/dL (ref 13.0–17.0)
Potassium: 4 mmol/L (ref 3.5–5.1)
Sodium: 142 mmol/L (ref 135–145)
TCO2: 23 mmol/L (ref 22–32)

## 2022-11-03 SURGERY — REPAIR, HERNIA, INGUINAL, ADULT
Anesthesia: General | Site: Inguinal | Laterality: Right

## 2022-11-03 MED ORDER — BUPIVACAINE HCL (300 MG DOSE) 3 X 100 MG IL IMPL
DRUG_IMPLANT | Status: AC
  Filled 2022-11-03: qty 100

## 2022-11-03 MED ORDER — PROPOFOL 10 MG/ML IV BOLUS
INTRAVENOUS | Status: AC
  Filled 2022-11-03: qty 20

## 2022-11-03 MED ORDER — SODIUM CHLORIDE 0.9 % IR SOLN
Status: DC | PRN
  Administered 2022-11-03: 1000 mL

## 2022-11-03 MED ORDER — HYDROMORPHONE HCL 1 MG/ML IJ SOLN
0.2500 mg | INTRAMUSCULAR | Status: DC | PRN
  Administered 2022-11-03: 0.5 mg via INTRAVENOUS
  Filled 2022-11-03: qty 0.5

## 2022-11-03 MED ORDER — OXYCODONE HCL 5 MG PO TABS: 5.0000 mg | ORAL_TABLET | Freq: Four times a day (QID) | ORAL | 0 refills | Status: AC | PRN

## 2022-11-03 MED ORDER — CHLORHEXIDINE GLUCONATE CLOTH 2 % EX PADS: 6.0000 | MEDICATED_PAD | Freq: Once | CUTANEOUS | Status: DC

## 2022-11-03 MED ORDER — BUPIVACAINE HCL (300 MG DOSE) 3 X 100 MG IL IMPL
DRUG_IMPLANT | Status: DC | PRN
  Administered 2022-11-03: 300 mg

## 2022-11-03 MED ORDER — ORAL CARE MOUTH RINSE: 15.0000 mL | Freq: Once | OROMUCOSAL | Status: AC

## 2022-11-03 MED ORDER — DEXAMETHASONE SODIUM PHOSPHATE 10 MG/ML IJ SOLN
INTRAMUSCULAR | Status: DC | PRN
  Administered 2022-11-03: 10 mg via INTRAVENOUS

## 2022-11-03 MED ORDER — ACETAMINOPHEN 500 MG PO TABS: 1000.0000 mg | ORAL_TABLET | Freq: Four times a day (QID) | ORAL | 0 refills | Status: AC

## 2022-11-03 MED ORDER — ONDANSETRON HCL 4 MG/2ML IJ SOLN
INTRAMUSCULAR | Status: DC | PRN
  Administered 2022-11-03: 4 mg via INTRAVENOUS

## 2022-11-03 MED ORDER — LIDOCAINE 2% (20 MG/ML) 5 ML SYRINGE
INTRAMUSCULAR | Status: DC | PRN
  Administered 2022-11-03: 60 mg via INTRAVENOUS

## 2022-11-03 MED ORDER — ROCURONIUM BROMIDE 10 MG/ML (PF) SYRINGE
PREFILLED_SYRINGE | INTRAVENOUS | Status: AC
  Filled 2022-11-03: qty 10

## 2022-11-03 MED ORDER — CHLORHEXIDINE GLUCONATE 0.12 % MT SOLN
15.0000 mL | Freq: Once | OROMUCOSAL | Status: AC
  Administered 2022-11-03: 15 mL via OROMUCOSAL
  Filled 2022-11-03: qty 15

## 2022-11-03 MED ORDER — SUGAMMADEX SODIUM 200 MG/2ML IV SOLN
INTRAVENOUS | Status: DC | PRN
  Administered 2022-11-03: 200 mg via INTRAVENOUS

## 2022-11-03 MED ORDER — FENTANYL CITRATE (PF) 100 MCG/2ML IJ SOLN
INTRAMUSCULAR | Status: DC | PRN
  Administered 2022-11-03: 25 ug via INTRAVENOUS
  Administered 2022-11-03: 50 ug via INTRAVENOUS
  Administered 2022-11-03: 25 ug via INTRAVENOUS

## 2022-11-03 MED ORDER — FENTANYL CITRATE (PF) 100 MCG/2ML IJ SOLN
INTRAMUSCULAR | Status: AC
  Filled 2022-11-03: qty 2

## 2022-11-03 MED ORDER — PROPOFOL 10 MG/ML IV BOLUS
INTRAVENOUS | Status: DC | PRN
  Administered 2022-11-03: 130 mg via INTRAVENOUS

## 2022-11-03 MED ORDER — ROCURONIUM BROMIDE 10 MG/ML (PF) SYRINGE
PREFILLED_SYRINGE | INTRAVENOUS | Status: DC | PRN
  Administered 2022-11-03: 60 mg via INTRAVENOUS

## 2022-11-03 MED ORDER — ONDANSETRON HCL 4 MG/2ML IJ SOLN: 4.0000 mg | Freq: Once | INTRAMUSCULAR | Status: DC | PRN

## 2022-11-03 MED ORDER — VANCOMYCIN HCL IN DEXTROSE 1-5 GM/200ML-% IV SOLN
1000.0000 mg | Freq: Once | INTRAVENOUS | Status: AC
  Administered 2022-11-03: 1000 mg via INTRAVENOUS
  Filled 2022-11-03: qty 200

## 2022-11-03 MED ORDER — LACTATED RINGERS IV SOLN: INTRAVENOUS | Status: DC

## 2022-11-03 MED ORDER — LIDOCAINE HCL (PF) 2 % IJ SOLN
INTRAMUSCULAR | Status: AC
  Filled 2022-11-03: qty 5

## 2022-11-03 MED ORDER — DOCUSATE SODIUM 100 MG PO CAPS: 100.0000 mg | ORAL_CAPSULE | Freq: Two times a day (BID) | ORAL | 2 refills | Status: DC

## 2022-11-03 SURGICAL SUPPLY — 30 items
ADH SKN CLS APL DERMABOND .7 (GAUZE/BANDAGES/DRESSINGS) ×1
CLOTH BEACON ORANGE TIMEOUT ST (SAFETY) ×1 IMPLANT
COVER LIGHT HANDLE STERIS (MISCELLANEOUS) ×2 IMPLANT
DERMABOND ADVANCED .7 DNX12 (GAUZE/BANDAGES/DRESSINGS) ×1 IMPLANT
DRAIN PENROSE 0.5X18 (DRAIN) ×1 IMPLANT
ELECT REM PT RETURN 9FT ADLT (ELECTROSURGICAL) ×1
ELECTRODE REM PT RTRN 9FT ADLT (ELECTROSURGICAL) ×1 IMPLANT
GAUZE 4X4 16PLY ~~LOC~~+RFID DBL (SPONGE) IMPLANT
GAUZE SPONGE 4X4 12PLY STRL (GAUZE/BANDAGES/DRESSINGS) ×1 IMPLANT
GLOVE BIO SURGEON STRL SZ7 (GLOVE) IMPLANT
GLOVE BIOGEL PI IND STRL 6.5 (GLOVE) ×1 IMPLANT
GLOVE BIOGEL PI IND STRL 7.0 (GLOVE) ×2 IMPLANT
GLOVE SURG SS PI 6.5 STRL IVOR (GLOVE) ×2 IMPLANT
GOWN STRL REUS W/TWL LRG LVL3 (GOWN DISPOSABLE) ×3 IMPLANT
INST SET MINOR GENERAL (KITS) ×1 IMPLANT
KIT TURNOVER KIT A (KITS) ×1 IMPLANT
MANIFOLD NEPTUNE II (INSTRUMENTS) ×1 IMPLANT
MESH PRE-SHAPE KEYHOLE 1.8X4 (Mesh General) IMPLANT
NS IRRIG 1000ML POUR BTL (IV SOLUTION) ×1 IMPLANT
PACK MINOR (CUSTOM PROCEDURE TRAY) ×1 IMPLANT
PAD ARMBOARD 7.5X6 YLW CONV (MISCELLANEOUS) ×1 IMPLANT
SET BASIN LINEN APH (SET/KITS/TRAYS/PACK) ×1 IMPLANT
SOL PREP PROV IODINE SCRUB 4OZ (MISCELLANEOUS) ×1 IMPLANT
SUT MNCRL AB 4-0 PS2 18 (SUTURE) ×1 IMPLANT
SUT NOVA NAB GS-22 2 2-0 T-19 (SUTURE) IMPLANT
SUT SILK 2 0 FSL 18 (SUTURE) IMPLANT
SUT VIC AB 2-0 CT1 27 (SUTURE) ×1
SUT VIC AB 2-0 CT1 TAPERPNT 27 (SUTURE) ×1 IMPLANT
SUT VIC AB 3-0 SH 27 (SUTURE) ×2
SUT VIC AB 3-0 SH 27X BRD (SUTURE) ×1 IMPLANT

## 2022-11-03 NOTE — Anesthesia Procedure Notes (Signed)
Procedure Name: Intubation Date/Time: 11/03/2022 7:35 AM  Performed by: Myna Bright, CRNAPre-anesthesia Checklist: Patient identified, Emergency Drugs available, Suction available and Patient being monitored Patient Re-evaluated:Patient Re-evaluated prior to induction Oxygen Delivery Method: Circle system utilized Preoxygenation: Pre-oxygenation with 100% oxygen Induction Type: IV induction Ventilation: Mask ventilation without difficulty Laryngoscope Size: Mac and 4 Grade View: Grade I Tube type: Oral Tube size: 7.5 mm Number of attempts: 1 Airway Equipment and Method: Stylet Placement Confirmation: ETT inserted through vocal cords under direct vision, positive ETCO2 and breath sounds checked- equal and bilateral Secured at: 22 cm Tube secured with: Tape Dental Injury: Teeth and Oropharynx as per pre-operative assessment

## 2022-11-03 NOTE — Anesthesia Postprocedure Evaluation (Signed)
Anesthesia Post Note  Patient: Eduard Roux Monterrosa  Procedure(s) Performed: HERNIA REPAIR INGUINAL ADULT W/ MESH (Right: Inguinal)  Patient location during evaluation: Phase II Anesthesia Type: General Level of consciousness: awake and alert and oriented Pain management: pain level controlled Vital Signs Assessment: post-procedure vital signs reviewed and stable Respiratory status: spontaneous breathing, nonlabored ventilation and respiratory function stable Cardiovascular status: blood pressure returned to baseline and stable Postop Assessment: no apparent nausea or vomiting Anesthetic complications: no  No notable events documented.   Last Vitals:  Vitals:   11/03/22 1015 11/03/22 1025  BP: 130/84 (!) 129/93  Pulse: 74 78  Resp: 16 18  Temp:  36.6 C  SpO2: 95% 95%    Last Pain:  Vitals:   11/03/22 1044  TempSrc:   PainSc: 0-No pain                 Sigmund Morera C Jaythan Hinely

## 2022-11-03 NOTE — Discharge Instructions (Signed)
Ambulatory Surgery Discharge Instructions  General Anesthesia or Sedation Do not drive or operate heavy machinery for 24 hours.  Do not consume alcohol, tranquilizers, sleeping medications, or any non-prescribed medications for 24 hours. Do not make important decisions or sign any important papers in the next 24 hours. You should have someone with you tonight at home.  Activity  You are advised to go directly home from the hospital.  Restrict your activities and rest for a day.  Resume light activity tomorrow. No heavy lifting over 10 lbs or strenuous exercise.  Fluids and Diet Begin with clear liquids, bouillon, dry toast, soda crackers.  If not nauseated, you may go to a regular diet when you desire.  Greasy and spicy foods are not advised.  Medications  If you have not had a bowel movement in 24 hours, take 2 tablespoons over the counter Milk of mag.             You May resume your blood thinners tomorrow (Aspirin, coumadin, or other).  You are being discharged with prescriptions for Opioid/Narcotic Medications: There are some specific considerations for these medications that you should know. Opioid Meds have risks & benefits. Addiction to these meds is always a concern with prolonged use Take medication only as directed Do not drive while taking narcotic pain medication Do not crush tablets or capsules Do not use a different container than medication was dispensed in Lock the container of medication in a cool, dry place out of reach of children and pets. Opioid medication can cause addiction Do not share with anyone else (this is a felony) Do not store medications for future use. Dispose of them properly.     Disposal:  Find a [facility] household drug take back site near you.  If you can't get to a drug take back site, use the recipe below as a last resort to dispose of expired, unused or unwanted drugs. Disposal  (Do not dispose chemotherapy drugs this way, talk to your  prescribing doctor instead.) Step 1: Mix drugs (do not crush) with dirt, kitty litter, or used coffee grounds and add a small amount of water to dissolve any solid medications. Step 2: Seal drugs in plastic bag. Step 3: Place plastic bag in trash. Step 4: Take prescription container and scratch out personal information, then recycle or throw away.  Operative Site  You have a liquid bandage over your incisions, this will begin to flake off in about a week. Ok to shower tomorrow. Keep wound clean and dry. No baths or swimming. No lifting more than 10 pounds.  Contact Information: If you have questions or concerns, please call our office, [phone], Monday- Thursday 8AM-5PM and Friday 8AM-12Noon.  If it is after hours or on the weekend, please call [facility] Main Number, [phone], and ask to speak to the surgeon on call for Dr. Cordaro Mukai at [place].   SPECIFIC COMPLICATIONS TO WATCH FOR: Inability to urinate Fever over 101? F by mouth Nausea and vomiting lasting longer than 24 hours. Pain not relieved by medication ordered Swelling around the operative site Increased redness, warmth, hardness, around operative area Numbness, tingling, or cold fingers or toes Blood -soaked dressing, (small amounts of oozing may be normal) Increasing and progressive drainage from surgical area or exam site  

## 2022-11-03 NOTE — Interval H&P Note (Signed)
History and Physical Interval Note:  11/03/2022 7:23 AM  Bob Holland  has presented today for surgery, with the diagnosis of RIGHT INGUINAL HERNIA.  The various methods of treatment have been discussed with the patient and family. After consideration of risks, benefits and other options for treatment, the patient has consented to  Procedure(s): HERNIA REPAIR INGUINAL ADULT W/ MESH (Right) as a surgical intervention.  The patient's history has been reviewed, patient examined, no change in status, stable for surgery.  I have reviewed the patient's chart and labs.  Questions were answered to the patient's satisfaction.     [place]

## 2022-11-03 NOTE — Anesthesia Preprocedure Evaluation (Signed)
Anesthesia Evaluation  Patient identified by MRN, date of birth, ID band Patient awake    Reviewed: Allergy & Precautions, H&P , NPO status , Patient's Chart, lab work & pertinent test results  History of Anesthesia Complications (+) PROLONGED EMERGENCE and history of anesthetic complications  Airway Mallampati: I  TM Distance: >3 FB Neck ROM: Full    Dental  (+) Missing, Poor Dentition, Chipped, Dental Advisory Given   Pulmonary former smoker   Pulmonary exam normal        Cardiovascular negative cardio ROS  Rhythm:Irregular Rate:Normal     Neuro/Psych negative neurological ROS  negative psych ROS   GI/Hepatic Neg liver ROS, hiatal hernia, PUD,GERD  ,,  Endo/Other  negative endocrine ROS    Renal/GU negative Renal ROS  negative genitourinary   Musculoskeletal  (+) Arthritis , Osteoarthritis,    Abdominal   Peds negative pediatric ROS (+)  Hematology negative hematology ROS (+)   Anesthesia Other Findings   Reproductive/Obstetrics negative OB ROS                             Anesthesia Physical Anesthesia Plan  ASA: 3  Anesthesia Plan: General   Post-op Pain Management: Dilaudid IV   Induction: Intravenous  PONV Risk Score and Plan: 3 and Ondansetron  Airway Management Planned: Oral ETT  Additional Equipment:   Intra-op Plan:   Post-operative Plan:   Informed Consent: I have reviewed the patients History and Physical, chart, labs and discussed the procedure including the risks, benefits and alternatives for the proposed anesthesia with the patient or authorized representative who has indicated his/her understanding and acceptance.     Dental advisory given  Plan Discussed with: CRNA and Surgeon  Anesthesia Plan Comments:         Anesthesia Quick Evaluation

## 2022-11-03 NOTE — Transfer of Care (Signed)
Immediate Anesthesia Transfer of Care Note  Patient: Bob Holland  Procedure(s) Performed: HERNIA REPAIR INGUINAL ADULT W/ MESH (Right: Inguinal)  Patient Location: PACU  Anesthesia Type:General  Level of Consciousness: awake, alert , oriented, and patient cooperative  Airway & Oxygen Therapy: Patient Spontanous Breathing and Patient connected to nasal cannula oxygen  Post-op Assessment: Report given to RN, Post -op Vital signs reviewed and stable, and Patient moving all extremities  Post vital signs: Reviewed and stable  Last Vitals:  Vitals Value Taken Time  BP 124/75 11/03/22 0932  Temp    Pulse 71 11/03/22 0934  Resp 17 11/03/22 0934  SpO2 99 % 11/03/22 0934  Vitals shown include unvalidated device data.  Last Pain:  Vitals:   11/03/22 0648  TempSrc: Oral  PainSc: 0-No pain         Complications: No notable events documented.

## 2022-11-03 NOTE — Op Note (Signed)
[facility] Operative Note  11/03/22  Preoperative Diagnosis: Right inguinal hernia    Postoperative Diagnosis: Same   Procedure(s) Performed: Right inguinal hernia repair with mesh   Surgeon: Graciella Freer, DO    Assistants: Marquita Palms, RNFA   Anesthesia: General endotracheal   Anesthesiologist: Denese Killings, MD    Specimens: None   Estimated Blood Loss: Minimal   Blood Replacement: None    Complications: None   Wound Class: Clean   Operative Indications: Patient is a 88 year old male who presents for open right inguinal hernia repair with mesh.  He has noted a bulge in his right groin with some discomfort.  He would like his inguinal hernia repaired at this time.  All risks and benefits of performing this procedure were discussed with the patient including pain, infection, bleeding, damage to the surrounding structures, and need for more procedures or surgery. The patient voiced understanding of the procedure, all questions were sought and answered, and consent was obtained.  Findings: Indirect right inguinal hernia with weakening of the inguinal floor   Procedure: The patient was taken to the operating room and placed supine. General endotracheal anesthesia was induced. Intravenous antibiotics were administered per protocol.  A time out was preformed verifying the correct patient, procedure, site, positioning and implants.  The right groin and scrotum were prepared and draped in the usual sterile fashion.  Time out was performed.  An incision was made in a natural skin crease between the pubic tubercle and the anterior superior iliac spine.  The incision was deepened with electrocautery through Scarpa's and Camper's fascia until the aponeurosis of the external oblique was encountered.  This was cleaned and the external ring was exposed.  An incision was made in the midportion of the external oblique aponeurosis in the direction of its  fibers. The ilioinguinal nerve was identified and was protected throughout the dissection.  Flaps of the external oblique were developed cephalad and inferiorly.    The cord was identified and it was gently dissented free at the pubic tubercle and encircled with a Penrose drain.  Attention was then directed at the anteromedial aspect of the cord, where an indirect hernia sac was identified.  The sac was carefully dissected free from the cord down to the level of the internal ring.  The vas and testicular vessels were identified and protected from harm.  Once the sac was dissected free from the cords, the Penrose was placed around the cord which was retracted inferiorly out of the field of view.  The sac was entered and noted to have no contents.  The hernia sac was suture ligated. The hernia was reduced into the internal ring without difficulty. Attention was then turned to the floor of the canal, which was grossly weakened without any defined defect or sac.  A pursestring stitch was placed around the floor to help reduce herniating contents. The Bard Keyhole Mesh was sutured to the inguinal ligament inferiorly starting at the pubic tubercle using 2-0 Novafil interrupted sutures.  The mesh was sutured superiorly to the conjoint tendon using 2-0 Novafil interrupted sutures.  Care was taken to ensure the mesh was placed in a relaxed fashion to avoid excessive tension and no neurovascular structures were caught in the repair.  Laterally the tails of the mesh were crossed and the internal ring was recreated, allowing for passage of cords without tension.   Hemostasis was adequate.  The Penrose was removed.  The external oblique aponeurosis was closed  with a 2-0 Vicryl suture in a running fashion, taking care to not catch the ilioinguinal nerve in the suture line.  Scarpa's fashion was closed with 3-0 Vicryl suture in a running fashion.  The dermis was closed with interrupted 3-0 Vicryl sutures. The skin was closed  with a subcuticular 4-0 Monocryl suture.  Dermabond was applied.  Xaracoll was inserted within all layers of closure.   The testis was gently pulled down into its anatomic position in the scrotum.  The patient tolerated the procedure well and was taken to the PACU in stable condition. All counts were correct at the end of the case.        Graciella Freer, DO  [facility] [address] [place], [place] [address] [phone] (office)

## 2022-11-03 NOTE — Progress Notes (Signed)
[facility]  Spoke with the patient's son in the consultation room.  I explained that he tolerated the procedure without difficulty.  He has dissolvable stitches under the skin with overlying skin glue.  This will flake off in 10 to 14 days.  I discharged him home with a prescription for narcotic pain medication that they should take as needed for pain.  I also want him taking scheduled Tylenol.  If they take the narcotic pain medication, they should take a stool softener as well.  No lifting more than 10 pounds for the next 4-6 weeks.  The patient will follow-up with me in 2 weeks.  All questions were answered to his expressed satisfaction.  Graciella Freer, DO [facility] [address] [place], [place] [address] [phone] (office)

## 2022-11-10 ENCOUNTER — Encounter (HOSPITAL_COMMUNITY): Payer: Self-pay | Admitting: Surgery

## 2022-11-17 ENCOUNTER — Ambulatory Visit: Payer: Medicare PPO | Admitting: Surgery

## 2022-11-17 ENCOUNTER — Encounter: Payer: Self-pay | Admitting: Surgery

## 2022-11-17 VITALS — BP 140/89 | HR 67 | Temp 97.7°F | Resp 16 | Ht 72.0 in | Wt 219.0 lb

## 2022-11-17 DIAGNOSIS — Z09 Encounter for follow-up examination after completed treatment for conditions other than malignant neoplasm: Secondary | ICD-10-CM

## 2022-11-17 NOTE — Progress Notes (Signed)
[facility] Note   HPI:  89 y.o. Male presents to clinic for post-op follow-up status post open right inguinal hernia repair with mesh on 12/28.  He has been doing well since his surgery.  He denies really using the narcotic pain medication for pain control.  He was only taking intermittent Tylenol as needed for pain.  He is tolerating a diet without nausea and vomiting, moving his bowels without issue.  He has noted some swelling in his groin, and denies using any ice for the last week.  Denies fevers and chills.  Review of Systems:  All other review of systems: otherwise negative   Vital Signs:  BP (!) 140/89   Pulse 67   Temp 97.7 F (36.5 C) (Oral)   Resp 16   Ht 6' (1.829 m)   Wt 219 lb (99.3 kg)   SpO2 94%   BMI 29.70 kg/m    Physical Exam:  Physical Exam Vitals reviewed.  Constitutional:      Appearance: Normal appearance.  Abdominal:     Comments: Abdomen soft, nondistended, no percussion tenderness, nontender palpation; right inguinal incision healing well with Dermabond in place, mild edema without erythema or ecchymosis  Neurological:     Mental Status: He is alert.     Laboratory studies: None  Imaging:  None  Assessment:  89 y.o. yo Male who presents for follow-up status post open right inguinal hernia repair with mesh on 12/28.  Plan:  -Patient overall doing very well since surgery, pain control, tolerating diet, moving his bowels -Advised him to put antibiotic ointment on his incision to help loosen the remaining skin glue -Also advised him to ice his groin, especially in the evenings after he has been up and walking to decrease swelling.  Swelling will continue to improve with time -Follow up as needed  All of the above recommendations were discussed with the patient and patient's family, and all of patient's and family's questions were answered to their expressed satisfaction.  Graciella Freer, DO [facility] [address] [place], [place] [address] [phone] (office)

## 2023-12-05 DIAGNOSIS — M8580 Other specified disorders of bone density and structure, unspecified site: Secondary | ICD-10-CM | POA: Diagnosis not present

## 2023-12-05 DIAGNOSIS — M4317 Spondylolisthesis, lumbosacral region: Secondary | ICD-10-CM | POA: Diagnosis not present

## 2023-12-05 DIAGNOSIS — M545 Low back pain, unspecified: Secondary | ICD-10-CM | POA: Diagnosis not present

## 2023-12-05 DIAGNOSIS — Z96642 Presence of left artificial hip joint: Secondary | ICD-10-CM | POA: Diagnosis not present

## 2023-12-05 DIAGNOSIS — R52 Pain, unspecified: Secondary | ICD-10-CM | POA: Diagnosis not present

## 2023-12-05 DIAGNOSIS — C4490 Unspecified malignant neoplasm of skin, unspecified: Secondary | ICD-10-CM | POA: Diagnosis not present

## 2023-12-05 DIAGNOSIS — Z299 Encounter for prophylactic measures, unspecified: Secondary | ICD-10-CM | POA: Diagnosis not present

## 2023-12-05 DIAGNOSIS — M4856XA Collapsed vertebra, not elsewhere classified, lumbar region, initial encounter for fracture: Secondary | ICD-10-CM | POA: Diagnosis not present

## 2023-12-05 DIAGNOSIS — M1611 Unilateral primary osteoarthritis, right hip: Secondary | ICD-10-CM | POA: Diagnosis not present

## 2023-12-05 DIAGNOSIS — M25551 Pain in right hip: Secondary | ICD-10-CM | POA: Diagnosis not present

## 2024-01-30 DIAGNOSIS — R52 Pain, unspecified: Secondary | ICD-10-CM | POA: Diagnosis not present

## 2024-01-30 DIAGNOSIS — C4490 Unspecified malignant neoplasm of skin, unspecified: Secondary | ICD-10-CM | POA: Diagnosis not present

## 2024-01-30 DIAGNOSIS — M1990 Unspecified osteoarthritis, unspecified site: Secondary | ICD-10-CM | POA: Diagnosis not present

## 2024-01-30 DIAGNOSIS — Z299 Encounter for prophylactic measures, unspecified: Secondary | ICD-10-CM | POA: Diagnosis not present

## 2024-03-02 DIAGNOSIS — M25572 Pain in left ankle and joints of left foot: Secondary | ICD-10-CM | POA: Diagnosis not present

## 2024-03-02 DIAGNOSIS — R0689 Other abnormalities of breathing: Secondary | ICD-10-CM | POA: Diagnosis not present

## 2024-03-02 DIAGNOSIS — N390 Urinary tract infection, site not specified: Secondary | ICD-10-CM | POA: Diagnosis not present

## 2024-03-02 DIAGNOSIS — S72041D Displaced fracture of base of neck of right femur, subsequent encounter for closed fracture with routine healing: Secondary | ICD-10-CM | POA: Diagnosis not present

## 2024-03-02 DIAGNOSIS — E785 Hyperlipidemia, unspecified: Secondary | ICD-10-CM | POA: Diagnosis not present

## 2024-03-02 DIAGNOSIS — R4182 Altered mental status, unspecified: Secondary | ICD-10-CM | POA: Diagnosis not present

## 2024-03-02 DIAGNOSIS — S72051A Unspecified fracture of head of right femur, initial encounter for closed fracture: Secondary | ICD-10-CM | POA: Diagnosis not present

## 2024-03-02 DIAGNOSIS — I2510 Atherosclerotic heart disease of native coronary artery without angina pectoris: Secondary | ICD-10-CM | POA: Diagnosis not present

## 2024-03-02 DIAGNOSIS — N138 Other obstructive and reflux uropathy: Secondary | ICD-10-CM | POA: Diagnosis not present

## 2024-03-02 DIAGNOSIS — M1990 Unspecified osteoarthritis, unspecified site: Secondary | ICD-10-CM | POA: Diagnosis not present

## 2024-03-02 DIAGNOSIS — N400 Enlarged prostate without lower urinary tract symptoms: Secondary | ICD-10-CM | POA: Diagnosis not present

## 2024-03-02 DIAGNOSIS — E559 Vitamin D deficiency, unspecified: Secondary | ICD-10-CM | POA: Diagnosis not present

## 2024-03-02 DIAGNOSIS — R079 Chest pain, unspecified: Secondary | ICD-10-CM | POA: Diagnosis not present

## 2024-03-02 DIAGNOSIS — N1831 Chronic kidney disease, stage 3a: Secondary | ICD-10-CM | POA: Diagnosis not present

## 2024-03-02 DIAGNOSIS — K5900 Constipation, unspecified: Secondary | ICD-10-CM | POA: Diagnosis not present

## 2024-03-02 DIAGNOSIS — Z743 Need for continuous supervision: Secondary | ICD-10-CM | POA: Diagnosis not present

## 2024-03-02 DIAGNOSIS — G250 Essential tremor: Secondary | ICD-10-CM | POA: Diagnosis not present

## 2024-03-02 DIAGNOSIS — N3001 Acute cystitis with hematuria: Secondary | ICD-10-CM | POA: Diagnosis not present

## 2024-03-02 DIAGNOSIS — M542 Cervicalgia: Secondary | ICD-10-CM | POA: Diagnosis not present

## 2024-03-02 DIAGNOSIS — Z792 Long term (current) use of antibiotics: Secondary | ICD-10-CM | POA: Diagnosis not present

## 2024-03-02 DIAGNOSIS — N401 Enlarged prostate with lower urinary tract symptoms: Secondary | ICD-10-CM | POA: Diagnosis not present

## 2024-03-02 DIAGNOSIS — M47812 Spondylosis without myelopathy or radiculopathy, cervical region: Secondary | ICD-10-CM | POA: Diagnosis not present

## 2024-03-02 DIAGNOSIS — S199XXA Unspecified injury of neck, initial encounter: Secondary | ICD-10-CM | POA: Diagnosis not present

## 2024-03-02 DIAGNOSIS — W19XXXA Unspecified fall, initial encounter: Secondary | ICD-10-CM | POA: Diagnosis not present

## 2024-03-02 DIAGNOSIS — N179 Acute kidney failure, unspecified: Secondary | ICD-10-CM | POA: Diagnosis not present

## 2024-03-02 DIAGNOSIS — R140 Abdominal distension (gaseous): Secondary | ICD-10-CM | POA: Diagnosis not present

## 2024-03-02 DIAGNOSIS — Z471 Aftercare following joint replacement surgery: Secondary | ICD-10-CM | POA: Diagnosis not present

## 2024-03-02 DIAGNOSIS — M5031 Other cervical disc degeneration,  high cervical region: Secondary | ICD-10-CM | POA: Diagnosis not present

## 2024-03-02 DIAGNOSIS — Z66 Do not resuscitate: Secondary | ICD-10-CM | POA: Diagnosis not present

## 2024-03-02 DIAGNOSIS — M545 Low back pain, unspecified: Secondary | ICD-10-CM | POA: Diagnosis not present

## 2024-03-02 DIAGNOSIS — M25551 Pain in right hip: Secondary | ICD-10-CM | POA: Diagnosis not present

## 2024-03-02 DIAGNOSIS — S72041A Displaced fracture of base of neck of right femur, initial encounter for closed fracture: Secondary | ICD-10-CM | POA: Diagnosis not present

## 2024-03-02 DIAGNOSIS — Z96641 Presence of right artificial hip joint: Secondary | ICD-10-CM | POA: Diagnosis not present

## 2024-03-02 DIAGNOSIS — Z79899 Other long term (current) drug therapy: Secondary | ICD-10-CM | POA: Diagnosis not present

## 2024-03-02 DIAGNOSIS — K449 Diaphragmatic hernia without obstruction or gangrene: Secondary | ICD-10-CM | POA: Diagnosis not present

## 2024-03-02 DIAGNOSIS — Z96643 Presence of artificial hip joint, bilateral: Secondary | ICD-10-CM | POA: Diagnosis not present

## 2024-03-02 DIAGNOSIS — R52 Pain, unspecified: Secondary | ICD-10-CM | POA: Diagnosis not present

## 2024-03-02 DIAGNOSIS — I959 Hypotension, unspecified: Secondary | ICD-10-CM | POA: Diagnosis not present

## 2024-03-02 DIAGNOSIS — S0990XA Unspecified injury of head, initial encounter: Secondary | ICD-10-CM | POA: Diagnosis not present

## 2024-03-02 DIAGNOSIS — W1781XA Fall down embankment (hill), initial encounter: Secondary | ICD-10-CM | POA: Diagnosis not present

## 2024-03-02 DIAGNOSIS — S72001A Fracture of unspecified part of neck of right femur, initial encounter for closed fracture: Secondary | ICD-10-CM | POA: Diagnosis not present

## 2024-03-02 DIAGNOSIS — R8290 Unspecified abnormal findings in urine: Secondary | ICD-10-CM | POA: Diagnosis not present

## 2024-03-07 DIAGNOSIS — R140 Abdominal distension (gaseous): Secondary | ICD-10-CM | POA: Diagnosis not present

## 2024-03-07 DIAGNOSIS — N189 Chronic kidney disease, unspecified: Secondary | ICD-10-CM | POA: Diagnosis not present

## 2024-03-07 DIAGNOSIS — N400 Enlarged prostate without lower urinary tract symptoms: Secondary | ICD-10-CM | POA: Diagnosis not present

## 2024-03-07 DIAGNOSIS — E559 Vitamin D deficiency, unspecified: Secondary | ICD-10-CM | POA: Diagnosis not present

## 2024-03-07 DIAGNOSIS — I959 Hypotension, unspecified: Secondary | ICD-10-CM | POA: Diagnosis not present

## 2024-03-07 DIAGNOSIS — Z96641 Presence of right artificial hip joint: Secondary | ICD-10-CM | POA: Diagnosis not present

## 2024-03-07 DIAGNOSIS — N401 Enlarged prostate with lower urinary tract symptoms: Secondary | ICD-10-CM | POA: Diagnosis not present

## 2024-03-07 DIAGNOSIS — Z79899 Other long term (current) drug therapy: Secondary | ICD-10-CM | POA: Diagnosis not present

## 2024-03-07 DIAGNOSIS — S72001A Fracture of unspecified part of neck of right femur, initial encounter for closed fracture: Secondary | ICD-10-CM | POA: Diagnosis not present

## 2024-03-07 DIAGNOSIS — M542 Cervicalgia: Secondary | ICD-10-CM | POA: Diagnosis not present

## 2024-03-07 DIAGNOSIS — M1990 Unspecified osteoarthritis, unspecified site: Secondary | ICD-10-CM | POA: Diagnosis not present

## 2024-03-07 DIAGNOSIS — Z7189 Other specified counseling: Secondary | ICD-10-CM | POA: Diagnosis not present

## 2024-03-07 DIAGNOSIS — K567 Ileus, unspecified: Secondary | ICD-10-CM | POA: Diagnosis not present

## 2024-03-07 DIAGNOSIS — G4700 Insomnia, unspecified: Secondary | ICD-10-CM | POA: Diagnosis not present

## 2024-03-07 DIAGNOSIS — M545 Low back pain, unspecified: Secondary | ICD-10-CM | POA: Diagnosis not present

## 2024-03-07 DIAGNOSIS — R109 Unspecified abdominal pain: Secondary | ICD-10-CM | POA: Diagnosis not present

## 2024-03-07 DIAGNOSIS — Z743 Need for continuous supervision: Secondary | ICD-10-CM | POA: Diagnosis not present

## 2024-03-07 DIAGNOSIS — K5900 Constipation, unspecified: Secondary | ICD-10-CM | POA: Diagnosis not present

## 2024-03-07 DIAGNOSIS — S72041D Displaced fracture of base of neck of right femur, subsequent encounter for closed fracture with routine healing: Secondary | ICD-10-CM | POA: Diagnosis not present

## 2024-03-07 DIAGNOSIS — R42 Dizziness and giddiness: Secondary | ICD-10-CM | POA: Diagnosis not present

## 2024-03-07 DIAGNOSIS — I2510 Atherosclerotic heart disease of native coronary artery without angina pectoris: Secondary | ICD-10-CM | POA: Diagnosis not present

## 2024-03-07 DIAGNOSIS — G250 Essential tremor: Secondary | ICD-10-CM | POA: Diagnosis not present

## 2024-03-07 DIAGNOSIS — N1831 Chronic kidney disease, stage 3a: Secondary | ICD-10-CM | POA: Diagnosis not present

## 2024-03-07 DIAGNOSIS — W19XXXA Unspecified fall, initial encounter: Secondary | ICD-10-CM | POA: Diagnosis not present

## 2024-03-07 DIAGNOSIS — R52 Pain, unspecified: Secondary | ICD-10-CM | POA: Diagnosis not present

## 2024-03-07 DIAGNOSIS — R8290 Unspecified abnormal findings in urine: Secondary | ICD-10-CM | POA: Diagnosis not present

## 2024-03-07 DIAGNOSIS — N390 Urinary tract infection, site not specified: Secondary | ICD-10-CM | POA: Diagnosis not present

## 2024-03-12 DIAGNOSIS — Z79899 Other long term (current) drug therapy: Secondary | ICD-10-CM | POA: Diagnosis not present

## 2024-03-12 DIAGNOSIS — N401 Enlarged prostate with lower urinary tract symptoms: Secondary | ICD-10-CM | POA: Diagnosis not present

## 2024-03-12 DIAGNOSIS — S72041D Displaced fracture of base of neck of right femur, subsequent encounter for closed fracture with routine healing: Secondary | ICD-10-CM | POA: Diagnosis not present

## 2024-03-12 DIAGNOSIS — K5900 Constipation, unspecified: Secondary | ICD-10-CM | POA: Diagnosis not present

## 2024-03-12 DIAGNOSIS — M542 Cervicalgia: Secondary | ICD-10-CM | POA: Diagnosis not present

## 2024-03-12 DIAGNOSIS — R109 Unspecified abdominal pain: Secondary | ICD-10-CM | POA: Diagnosis not present

## 2024-03-12 DIAGNOSIS — K567 Ileus, unspecified: Secondary | ICD-10-CM | POA: Diagnosis not present

## 2024-03-12 DIAGNOSIS — N390 Urinary tract infection, site not specified: Secondary | ICD-10-CM | POA: Diagnosis not present

## 2024-03-13 DIAGNOSIS — M1990 Unspecified osteoarthritis, unspecified site: Secondary | ICD-10-CM | POA: Diagnosis not present

## 2024-03-13 DIAGNOSIS — R52 Pain, unspecified: Secondary | ICD-10-CM | POA: Diagnosis not present

## 2024-03-13 DIAGNOSIS — G250 Essential tremor: Secondary | ICD-10-CM | POA: Diagnosis not present

## 2024-03-13 DIAGNOSIS — E559 Vitamin D deficiency, unspecified: Secondary | ICD-10-CM | POA: Diagnosis not present

## 2024-03-13 DIAGNOSIS — N189 Chronic kidney disease, unspecified: Secondary | ICD-10-CM | POA: Diagnosis not present

## 2024-03-13 DIAGNOSIS — S72041D Displaced fracture of base of neck of right femur, subsequent encounter for closed fracture with routine healing: Secondary | ICD-10-CM | POA: Diagnosis not present

## 2024-03-13 DIAGNOSIS — N390 Urinary tract infection, site not specified: Secondary | ICD-10-CM | POA: Diagnosis not present

## 2024-03-13 DIAGNOSIS — N401 Enlarged prostate with lower urinary tract symptoms: Secondary | ICD-10-CM | POA: Diagnosis not present

## 2024-03-14 DIAGNOSIS — S72041D Displaced fracture of base of neck of right femur, subsequent encounter for closed fracture with routine healing: Secondary | ICD-10-CM | POA: Diagnosis not present

## 2024-03-14 DIAGNOSIS — N401 Enlarged prostate with lower urinary tract symptoms: Secondary | ICD-10-CM | POA: Diagnosis not present

## 2024-03-14 DIAGNOSIS — Z79899 Other long term (current) drug therapy: Secondary | ICD-10-CM | POA: Diagnosis not present

## 2024-03-14 DIAGNOSIS — N189 Chronic kidney disease, unspecified: Secondary | ICD-10-CM | POA: Diagnosis not present

## 2024-03-18 DIAGNOSIS — M1990 Unspecified osteoarthritis, unspecified site: Secondary | ICD-10-CM | POA: Diagnosis not present

## 2024-03-18 DIAGNOSIS — G250 Essential tremor: Secondary | ICD-10-CM | POA: Diagnosis not present

## 2024-03-18 DIAGNOSIS — R52 Pain, unspecified: Secondary | ICD-10-CM | POA: Diagnosis not present

## 2024-03-18 DIAGNOSIS — S72041D Displaced fracture of base of neck of right femur, subsequent encounter for closed fracture with routine healing: Secondary | ICD-10-CM | POA: Diagnosis not present

## 2024-03-18 DIAGNOSIS — Z7189 Other specified counseling: Secondary | ICD-10-CM | POA: Diagnosis not present

## 2024-03-18 DIAGNOSIS — Z79899 Other long term (current) drug therapy: Secondary | ICD-10-CM | POA: Diagnosis not present

## 2024-03-19 DIAGNOSIS — I2510 Atherosclerotic heart disease of native coronary artery without angina pectoris: Secondary | ICD-10-CM | POA: Diagnosis not present

## 2024-03-20 DIAGNOSIS — G250 Essential tremor: Secondary | ICD-10-CM | POA: Diagnosis not present

## 2024-03-20 DIAGNOSIS — E559 Vitamin D deficiency, unspecified: Secondary | ICD-10-CM | POA: Diagnosis not present

## 2024-03-20 DIAGNOSIS — G4700 Insomnia, unspecified: Secondary | ICD-10-CM | POA: Diagnosis not present

## 2024-03-20 DIAGNOSIS — K5900 Constipation, unspecified: Secondary | ICD-10-CM | POA: Diagnosis not present

## 2024-03-20 DIAGNOSIS — N401 Enlarged prostate with lower urinary tract symptoms: Secondary | ICD-10-CM | POA: Diagnosis not present

## 2024-03-20 DIAGNOSIS — Z79899 Other long term (current) drug therapy: Secondary | ICD-10-CM | POA: Diagnosis not present

## 2024-03-20 DIAGNOSIS — S72041D Displaced fracture of base of neck of right femur, subsequent encounter for closed fracture with routine healing: Secondary | ICD-10-CM | POA: Diagnosis not present

## 2024-03-20 DIAGNOSIS — M1990 Unspecified osteoarthritis, unspecified site: Secondary | ICD-10-CM | POA: Diagnosis not present

## 2024-03-20 DIAGNOSIS — N189 Chronic kidney disease, unspecified: Secondary | ICD-10-CM | POA: Diagnosis not present

## 2024-03-21 DIAGNOSIS — Z79899 Other long term (current) drug therapy: Secondary | ICD-10-CM | POA: Diagnosis not present

## 2024-03-21 DIAGNOSIS — G4700 Insomnia, unspecified: Secondary | ICD-10-CM | POA: Diagnosis not present

## 2024-03-21 DIAGNOSIS — R42 Dizziness and giddiness: Secondary | ICD-10-CM | POA: Diagnosis not present

## 2024-03-28 DIAGNOSIS — K5900 Constipation, unspecified: Secondary | ICD-10-CM | POA: Diagnosis not present

## 2024-03-28 DIAGNOSIS — E559 Vitamin D deficiency, unspecified: Secondary | ICD-10-CM | POA: Diagnosis not present

## 2024-03-28 DIAGNOSIS — M1990 Unspecified osteoarthritis, unspecified site: Secondary | ICD-10-CM | POA: Diagnosis not present

## 2024-03-28 DIAGNOSIS — Z79899 Other long term (current) drug therapy: Secondary | ICD-10-CM | POA: Diagnosis not present

## 2024-03-28 DIAGNOSIS — G4700 Insomnia, unspecified: Secondary | ICD-10-CM | POA: Diagnosis not present

## 2024-03-28 DIAGNOSIS — N189 Chronic kidney disease, unspecified: Secondary | ICD-10-CM | POA: Diagnosis not present

## 2024-03-28 DIAGNOSIS — G250 Essential tremor: Secondary | ICD-10-CM | POA: Diagnosis not present

## 2024-03-28 DIAGNOSIS — N401 Enlarged prostate with lower urinary tract symptoms: Secondary | ICD-10-CM | POA: Diagnosis not present

## 2024-03-28 DIAGNOSIS — R42 Dizziness and giddiness: Secondary | ICD-10-CM | POA: Diagnosis not present

## 2024-04-04 DIAGNOSIS — Z299 Encounter for prophylactic measures, unspecified: Secondary | ICD-10-CM | POA: Diagnosis not present

## 2024-04-04 DIAGNOSIS — N400 Enlarged prostate without lower urinary tract symptoms: Secondary | ICD-10-CM | POA: Diagnosis not present

## 2024-04-04 DIAGNOSIS — Z96649 Presence of unspecified artificial hip joint: Secondary | ICD-10-CM | POA: Diagnosis not present

## 2024-04-04 DIAGNOSIS — C4490 Unspecified malignant neoplasm of skin, unspecified: Secondary | ICD-10-CM | POA: Diagnosis not present

## 2024-04-15 DIAGNOSIS — M25551 Pain in right hip: Secondary | ICD-10-CM | POA: Diagnosis not present

## 2024-05-03 DIAGNOSIS — Z1339 Encounter for screening examination for other mental health and behavioral disorders: Secondary | ICD-10-CM | POA: Diagnosis not present

## 2024-05-03 DIAGNOSIS — Z299 Encounter for prophylactic measures, unspecified: Secondary | ICD-10-CM | POA: Diagnosis not present

## 2024-05-03 DIAGNOSIS — C4490 Unspecified malignant neoplasm of skin, unspecified: Secondary | ICD-10-CM | POA: Diagnosis not present

## 2024-05-03 DIAGNOSIS — Z7189 Other specified counseling: Secondary | ICD-10-CM | POA: Diagnosis not present

## 2024-05-03 DIAGNOSIS — Z1331 Encounter for screening for depression: Secondary | ICD-10-CM | POA: Diagnosis not present

## 2024-05-03 DIAGNOSIS — Z0000 Encounter for general adult medical examination without abnormal findings: Secondary | ICD-10-CM | POA: Diagnosis not present

## 2024-05-03 DIAGNOSIS — H6122 Impacted cerumen, left ear: Secondary | ICD-10-CM | POA: Diagnosis not present

## 2024-07-15 DIAGNOSIS — R350 Frequency of micturition: Secondary | ICD-10-CM | POA: Diagnosis not present

## 2024-07-15 DIAGNOSIS — C4490 Unspecified malignant neoplasm of skin, unspecified: Secondary | ICD-10-CM | POA: Diagnosis not present

## 2024-07-15 DIAGNOSIS — R52 Pain, unspecified: Secondary | ICD-10-CM | POA: Diagnosis not present

## 2024-07-15 DIAGNOSIS — N400 Enlarged prostate without lower urinary tract symptoms: Secondary | ICD-10-CM | POA: Diagnosis not present

## 2024-07-15 DIAGNOSIS — Z299 Encounter for prophylactic measures, unspecified: Secondary | ICD-10-CM | POA: Diagnosis not present

## 2024-08-16 DIAGNOSIS — E7800 Pure hypercholesterolemia, unspecified: Secondary | ICD-10-CM | POA: Diagnosis not present

## 2024-08-16 DIAGNOSIS — E0590 Thyrotoxicosis, unspecified without thyrotoxic crisis or storm: Secondary | ICD-10-CM | POA: Diagnosis not present

## 2024-08-16 DIAGNOSIS — R52 Pain, unspecified: Secondary | ICD-10-CM | POA: Diagnosis not present

## 2024-08-16 DIAGNOSIS — Z0000 Encounter for general adult medical examination without abnormal findings: Secondary | ICD-10-CM | POA: Diagnosis not present

## 2024-08-16 DIAGNOSIS — Z299 Encounter for prophylactic measures, unspecified: Secondary | ICD-10-CM | POA: Diagnosis not present

## 2024-08-16 DIAGNOSIS — Z23 Encounter for immunization: Secondary | ICD-10-CM | POA: Diagnosis not present

## 2024-08-16 DIAGNOSIS — R609 Edema, unspecified: Secondary | ICD-10-CM | POA: Diagnosis not present

## 2024-08-16 DIAGNOSIS — Z79899 Other long term (current) drug therapy: Secondary | ICD-10-CM | POA: Diagnosis not present

## 2024-08-16 DIAGNOSIS — Z125 Encounter for screening for malignant neoplasm of prostate: Secondary | ICD-10-CM | POA: Diagnosis not present

## 2024-09-02 DIAGNOSIS — R601 Generalized edema: Secondary | ICD-10-CM | POA: Diagnosis not present

## 2024-09-11 DIAGNOSIS — H9193 Unspecified hearing loss, bilateral: Secondary | ICD-10-CM | POA: Diagnosis not present

## 2024-09-11 DIAGNOSIS — I2510 Atherosclerotic heart disease of native coronary artery without angina pectoris: Secondary | ICD-10-CM | POA: Diagnosis not present

## 2024-09-11 DIAGNOSIS — N400 Enlarged prostate without lower urinary tract symptoms: Secondary | ICD-10-CM | POA: Diagnosis not present

## 2024-09-11 DIAGNOSIS — M8580 Other specified disorders of bone density and structure, unspecified site: Secondary | ICD-10-CM | POA: Diagnosis not present

## 2024-09-11 DIAGNOSIS — M1990 Unspecified osteoarthritis, unspecified site: Secondary | ICD-10-CM | POA: Diagnosis not present

## 2024-09-11 DIAGNOSIS — E669 Obesity, unspecified: Secondary | ICD-10-CM | POA: Diagnosis not present

## 2024-09-11 DIAGNOSIS — E785 Hyperlipidemia, unspecified: Secondary | ICD-10-CM | POA: Diagnosis not present

## 2024-09-11 DIAGNOSIS — N1831 Chronic kidney disease, stage 3a: Secondary | ICD-10-CM | POA: Diagnosis not present

## 2024-09-11 DIAGNOSIS — Z8249 Family history of ischemic heart disease and other diseases of the circulatory system: Secondary | ICD-10-CM | POA: Diagnosis not present

## 2024-09-17 DIAGNOSIS — M25551 Pain in right hip: Secondary | ICD-10-CM | POA: Diagnosis not present

## 2024-09-17 DIAGNOSIS — M1990 Unspecified osteoarthritis, unspecified site: Secondary | ICD-10-CM | POA: Diagnosis not present

## 2024-09-17 DIAGNOSIS — Z299 Encounter for prophylactic measures, unspecified: Secondary | ICD-10-CM | POA: Diagnosis not present

## 2024-09-17 DIAGNOSIS — E7800 Pure hypercholesterolemia, unspecified: Secondary | ICD-10-CM | POA: Diagnosis not present

## 2024-09-17 DIAGNOSIS — R609 Edema, unspecified: Secondary | ICD-10-CM | POA: Diagnosis not present
